# Patient Record
Sex: Female | Born: 2000 | Race: Black or African American | Hispanic: No | Marital: Single | Smoking: Never smoker
Health system: Southern US, Community
[De-identification: ages and names within clinical notes are randomized; demographics above are authoritative.]

## PROBLEM LIST (undated history)

## (undated) DIAGNOSIS — G43909 Migraine, unspecified, not intractable, without status migrainosus: Secondary | ICD-10-CM

## (undated) DIAGNOSIS — O009 Unspecified ectopic pregnancy without intrauterine pregnancy: Secondary | ICD-10-CM

## (undated) DIAGNOSIS — R011 Cardiac murmur, unspecified: Secondary | ICD-10-CM

## (undated) HISTORY — DX: Unspecified ectopic pregnancy without intrauterine pregnancy: O00.90

## (undated) HISTORY — PX: WISDOM TOOTH EXTRACTION: SHX21

---
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
## 2021-10-16 ENCOUNTER — Emergency Department (HOSPITAL_COMMUNITY): Payer: BC Managed Care – PPO

## 2021-10-16 ENCOUNTER — Encounter (HOSPITAL_COMMUNITY): Payer: Self-pay

## 2021-10-16 ENCOUNTER — Emergency Department (HOSPITAL_COMMUNITY)
Admission: EM | Admit: 2021-10-16 | Discharge: 2021-10-16 | Disposition: A | Discharge status: Home / Self Care | Payer: BC Managed Care – PPO | Attending: Emergency Medicine | Admitting: Emergency Medicine

## 2021-10-16 ENCOUNTER — Other Ambulatory Visit: Payer: Self-pay

## 2021-10-16 DIAGNOSIS — O209 Hemorrhage in early pregnancy, unspecified: Secondary | ICD-10-CM | POA: Diagnosis present

## 2021-10-16 DIAGNOSIS — O4690 Antepartum hemorrhage, unspecified, unspecified trimester: Secondary | ICD-10-CM

## 2021-10-16 DIAGNOSIS — N939 Abnormal uterine and vaginal bleeding, unspecified: Secondary | ICD-10-CM

## 2021-10-16 DIAGNOSIS — Z3A01 Less than 8 weeks gestation of pregnancy: Secondary | ICD-10-CM | POA: Diagnosis not present

## 2021-10-16 DIAGNOSIS — R102 Pelvic and perineal pain: Secondary | ICD-10-CM | POA: Insufficient documentation

## 2021-10-16 HISTORY — DX: Migraine, unspecified, not intractable, without status migrainosus: G43.909

## 2021-10-16 LAB — I-STAT BETA HCG BLOOD, ED (MC, WL, AP ONLY): I-stat hCG, quantitative: 73.3 m[IU]/mL — ABNORMAL HIGH (ref <5)

## 2021-10-16 LAB — CBC WITH DIFFERENTIAL/PLATELET
Abs Immature Granulocytes: 0.02 10*3/uL (ref 0.00–0.07)
Basophils Absolute: 0 10*3/uL (ref 0.0–0.1)
Basophils Relative: 0 %
Eosinophils Absolute: 0 10*3/uL (ref 0.0–0.5)
Eosinophils Relative: 1 %
HCT: 32.2 % — ABNORMAL LOW (ref 36.0–46.0)
Hemoglobin: 11.1 g/dL — ABNORMAL LOW (ref 12.0–15.0)
Immature Granulocytes: 0 %
Lymphocytes Relative: 17 %
Lymphs Abs: 0.8 10*3/uL (ref 0.7–4.0)
MCH: 26.4 pg (ref 26.0–34.0)
MCHC: 34.5 g/dL (ref 30.0–36.0)
MCV: 76.5 fL — ABNORMAL LOW (ref 80.0–100.0)
Monocytes Absolute: 0.3 10*3/uL (ref 0.1–1.0)
Monocytes Relative: 6 %
Neutro Abs: 3.5 10*3/uL (ref 1.7–7.7)
Neutrophils Relative %: 76 %
Platelets: 280 10*3/uL (ref 150–400)
RBC: 4.21 MIL/uL (ref 3.87–5.11)
RDW: 13.8 % (ref 11.5–15.5)
WBC: 4.7 10*3/uL (ref 4.0–10.5)
nRBC: 0 % (ref 0.0–0.2)

## 2021-10-16 LAB — ABO/RH: ABO/RH(D): A POS

## 2021-10-16 LAB — I-STAT CHEM 8, ED
BUN: 8 mg/dL (ref 6–20)
Calcium, Ion: 1.19 mmol/L (ref 1.15–1.40)
Chloride: 102 mmol/L (ref 98–111)
Creatinine, Ser: 0.7 mg/dL (ref 0.44–1.00)
Glucose, Bld: 152 mg/dL — ABNORMAL HIGH (ref 70–99)
HCT: 35 % — ABNORMAL LOW (ref 36.0–46.0)
Hemoglobin: 11.9 g/dL — ABNORMAL LOW (ref 12.0–15.0)
Potassium: 3.8 mmol/L (ref 3.5–5.1)
Sodium: 138 mmol/L (ref 135–145)
TCO2: 24 mmol/L (ref 22–32)

## 2021-10-16 LAB — HCG, QUANTITATIVE, PREGNANCY: hCG, Beta Chain, Quant, S: 90 m[IU]/mL — ABNORMAL HIGH (ref <5)

## 2021-10-16 MED ORDER — ACETAMINOPHEN 500 MG PO TABS
1000.0000 mg | ORAL_TABLET | Freq: Once | ORAL | Status: AC
  Administered 2021-10-16: 1000 mg via ORAL

## 2021-10-16 NOTE — ED Provider Notes (Signed)
Emergency Medicine Provider Triage Evaluation Note  Cassandra Beltran , a 20 y.o. female  was evaluated in triage.  Pt complains of vaginal bleeding.  Patient states she is approximately [redacted] weeks EGA.  She was seen at an urgent care on November 25 and had a positive pregnancy test she started having some bleeding last night as well as some cramping discomfort on the left lower abdomen..  Review of Systems  Positive: Vaginal bleeding, abdominal pain Negative: Fever  Physical Exam  BP 113/76 (BP Location: Right Arm)   Pulse 98   Temp 97.9 F (36.6 C) (Oral)   Resp 16   SpO2 100%  Gen:   Awake, no distress   Resp:  Normal effort  MSK:   Moves extremities without difficulty  Other:  Abdomen soft, no guarding, no peritoneal signs  Medical Decision Making  Medically screening exam initiated at 11:41 AM.  Appropriate orders placed.  Cassandra Beltran was informed that the remainder of the evaluation will be completed by another provider, this initial triage assessment does not replace that evaluation, and the importance of remaining in the ED until their evaluation is complete.  Will initiate work-up for possible ectopic versus threatened miscarriage.   Linwood Dibbles, MD 10/16/21 1143

## 2021-10-16 NOTE — Discharge Instructions (Addendum)
You have been seen and discharged from the emergency department.  Your pregnancy hormone is elevated at this visit.  You are approximately 4-[redacted] weeks pregnant based off of your last menstrual period.  Ultrasound does not identify an intrauterine gestation however this could be typical of early pregnancy.  It is also possible that you are having an early first trimester miscarriage.  It is extremely important that you follow-up within the next week with OB/GYN for repeat blood work and ultrasound for confirmation and further evaluation. Use pads for vaginal bleeding, avoid tampons. If you have any worsening symptoms, profuse vaginal bleeding, severe pelvic pain or further concerns for your health please return to an emergency department for further evaluation.

## 2021-10-16 NOTE — ED Provider Notes (Signed)
[facility] DEPT Provider Note   CSN: [id number] Arrival date & time: 10/16/21  1056     History Chief Complaint  Patient presents with   Vaginal Bleeding   Abdominal Pain    Cassandra Beltran is a 20 y.o. female.  HPI  20 year old female presents the emergency department with concern for vaginal bleeding in the setting of pregnancy.  Patient is G1, P0.  Last menstrual period.  She believes was 4 to 5 weeks ago.  Positive urine pregnancy test a couple days ago at urgent care.  She has not had any OB/GYN care yet.  Presents today with vaginal bleeding that was initially spotting overnight with bright red bleeding today.  Earlier today she passed a couple clots.  Feels as if the bleeding is resolving.  No significant lower pelvic pain.  Denies any fever or other abnormal vaginal discharge.  No dysuria.  Past Medical History:  Diagnosis Date   Heart murmur    Migraine     There are no problems to display for this patient.   History reviewed. No pertinent surgical history.   OB History   No obstetric history on file.     Family History  Problem Relation Age of Onset   Healthy Mother    Healthy Father     Social History   Tobacco Use   Smoking status: Never   Smokeless tobacco: Never  Vaping Use   Vaping Use: Never used  Substance Use Topics   Alcohol use: No   Drug use: No    Home Medications Prior to Admission medications   Not on File    Allergies    Patient has no known allergies.  Review of Systems   Review of Systems  Constitutional:  Positive for fatigue. Negative for chills and fever.  HENT:  Negative for congestion.   Eyes:  Negative for visual disturbance.  Respiratory:  Negative for shortness of breath.   Cardiovascular:  Negative for chest pain.  Gastrointestinal:  Negative for abdominal pain, diarrhea and vomiting.  Genitourinary:  Positive for vaginal bleeding. Negative for difficulty urinating, dysuria, flank  pain, pelvic pain, vaginal discharge and vaginal pain.  Skin:  Negative for rash.  Neurological:  Negative for headaches.   Physical Exam Updated Vital Signs BP 129/84   Pulse 78   Temp 97.9 F (36.6 C) (Oral)   Resp 18   Ht 5\' 3"  (1.6 m)   Wt 49 kg   LMP 09/08/2021   SpO2 100%   BMI 19.13 kg/m   Physical Exam Vitals and nursing note reviewed.  Constitutional:      General: She is not in acute distress.    Appearance: Normal appearance. She is not diaphoretic.  HENT:     Head: Normocephalic.     Mouth/Throat:     Mouth: Mucous membranes are moist.  Cardiovascular:     Rate and Rhythm: Normal rate.  Pulmonary:     Effort: Pulmonary effort is normal. No respiratory distress.  Abdominal:     Palpations: Abdomen is soft.     Tenderness: There is no abdominal tenderness. There is no guarding.  Genitourinary:    Comments: deferred Skin:    General: Skin is warm.  Neurological:     Mental Status: She is alert and oriented to person, place, and time. Mental status is at baseline.  Psychiatric:        Mood and Affect: Mood normal.    ED Results / Procedures /  Treatments   Labs (all labs ordered are listed, but only abnormal results are displayed) Labs Reviewed  CBC WITH DIFFERENTIAL/PLATELET - Abnormal; Notable for the following components:      Result Value   Hemoglobin 11.1 (*)    HCT 32.2 (*)    MCV 76.5 (*)    All other components within normal limits  I-STAT BETA HCG BLOOD, ED ([facility], [facility], [facility] ONLY) - Abnormal; Notable for the following components:   I-stat hCG, quantitative 73.3 (*)    All other components within normal limits  I-STAT CHEM 8, ED - Abnormal; Notable for the following components:   Glucose, Bld 152 (*)    Hemoglobin 11.9 (*)    HCT 35.0 (*)    All other components within normal limits  HCG, QUANTITATIVE, PREGNANCY  ABO/RH    EKG None  Radiology US OB LESS THAN 14 WEEKS WITH OB TRANSVAGINAL  Result Date: 10/16/2021 CLINICAL DATA:  Vaginal  bleeding and cramping. EXAM: OBSTETRIC <14 WK [place] AND TRANSVAGINAL OB US TECHNIQUE: Both transabdominal and transvaginal ultrasound examinations were performed for complete evaluation of the gestation as well as the maternal uterus, adnexal regions, and pelvic cul-de-sac. Transvaginal technique was performed to assess early pregnancy. COMPARISON:  None. FINDINGS: Intrauterine gestational sac: None Yolk sac:  Not Visualized. Embryo:  Not Visualized. Maternal uterus/adnexae: Subchorionic hemorrhage: Not applicable Right ovary: Appears normal. Left ovary: Normal containing corpus luteum. Other :None Free fluid:  Trace free fluid noted within the pelvis. IMPRESSION: 1. No intrauterine gestational sac, yolk sac, or fetal pole identified. Differential considerations include intrauterine pregnancy too early to be sonographically visualized, missed abortion, or ectopic pregnancy. Followup ultrasound is recommended in 10-14 days for further evaluation. 2. Left ovary corpus luteum. Electronically Signed   By: Kerby Moors M.D.   On: 10/16/2021 16:41    Procedures Procedures   Medications Ordered in ED Medications - No data to display  ED Course  I have reviewed the triage vital signs and the nursing notes.  Pertinent labs & imaging results that were available during my care of the patient were reviewed by me and considered in my medical decision making (see chart for details).    MDM Rules/Calculators/A&P                           20 year old female presents emergency department with vaginal bleeding in the setting of early pregnancy.  Patient is approximately 4 to [redacted] weeks pregnant based off of last menstrual period.  She denies any vaginal/pelvic pain besides mild cramping.  Patient had bright red bleeding with clots earlier, seems to be slowing down currently.  I-STAT hCG is elevated at 73, blood work is otherwise reassuring.  She is type a positive.  Pelvic ultrasound identifies no intrauterine  gestation, most likely compatible with early pregnancy or miscarriage.  Low suspicion for ectopic given the absence of pain at this time.  Patient is in the process of establishing OB/GYN care, she has an office that she is calling tomorrow.  I will also supply her with other clinic information.  She understands the importance of having repeat blood work hCG quant and repeat ultrasound for further evaluation.  Discussed strict return to ED precautions in regards to worsening bleeding, worsening pelvic pain, fever.  Patient at this time appears well, vitals are stable, abdomen is benign.  Patient at this time appears safe and stable for discharge and will be treated as an outpatient.  Discharge plan and strict return to ED precautions discussed, patient verbalizes understanding and agreement.  Final Clinical Impression(s) / ED Diagnoses Final diagnoses:  Vaginal bleeding in pregnancy    Rx / DC Orders ED Discharge Orders     None        Rozelle Logan, DO 10/16/21 2109

## 2021-10-16 NOTE — ED Triage Notes (Signed)
Patient reports that she is [redacted] weeks pregnant and had a slight amount of vaginal bleeding last night. Patient states when she woke this AM she had a moderate amount of vaginal bleeding and clots present. Patent c/o intermittent sharp LLQ pain that started last night.

## 2021-10-18 ENCOUNTER — Other Ambulatory Visit: Payer: Self-pay | Admitting: *Deleted

## 2021-10-18 DIAGNOSIS — O3680X0 Pregnancy with inconclusive fetal viability, not applicable or unspecified: Secondary | ICD-10-CM

## 2021-10-18 NOTE — Progress Notes (Signed)
Patient scheduled for stat beta hcg quant. Patient seen at [facility] ED on 10/16/2021 for vaginal bleeding and abdominal pain. Positive UPT at urgent.  Clovis Pu, RN

## 2021-10-19 ENCOUNTER — Other Ambulatory Visit: Payer: Self-pay

## 2021-10-19 ENCOUNTER — Other Ambulatory Visit (INDEPENDENT_AMBULATORY_CARE_PROVIDER_SITE_OTHER): Payer: Medicaid Other | Admitting: *Deleted

## 2021-10-19 ENCOUNTER — Telehealth: Payer: Self-pay

## 2021-10-19 DIAGNOSIS — O3680X0 Pregnancy with inconclusive fetal viability, not applicable or unspecified: Secondary | ICD-10-CM

## 2021-10-19 LAB — BETA HCG QUANT (REF LAB): hCG Quant: 92 m[IU]/mL

## 2021-10-19 NOTE — Progress Notes (Signed)
   Ms. Cassandra Beltran presents to [facility] for follow-up quant hCG blood draw today. She was seen in [facility] ED for  vaginal bleeding and abdominal pain  on 10/16/2021. Patient reporting bleeding today. Discussed with patient, we are following hCG levels today.  Valid contact number for patient confirmed. Patient will get a phone call or Mychart message regarding lab results.    Clovis Pu 10/19/2021 8:06 AM

## 2021-10-19 NOTE — Telephone Encounter (Signed)
Cassandra Beltran 2001/08/19 [id number]  Patient called and verified her identity via birth date and last 4 of her SSN.  Patient agreeable to results via phone and was informed of hCG results with minimal increase from 90 to 92.  Patient reports she continues to have bleeding that is "consistent."  She was informed of POC, as established by MD, to return to MAU on Sunday at 0800 for repeat hCG.  Informed that expectation would be to remain in MAU or be willing to return for further treatment if necessary.  Patient verbalizes understanding. Precautions given.  Patient without questions. Encouraged to call or send mychart message if any concerns should arise.  Cherre Robins MSN, CNM Advanced Practice Provider, [facility]  **This visit was completed, in its entirety, via telehealth communications.  I personally spent >/=2 minutes on the phone providing recommendations, education, and guidance.**

## 2021-10-21 ENCOUNTER — Inpatient Hospital Stay (HOSPITAL_COMMUNITY): Payer: Medicaid Other | Attending: Obstetrics and Gynecology

## 2021-10-21 ENCOUNTER — Encounter (HOSPITAL_COMMUNITY): Payer: Self-pay | Admitting: Obstetrics and Gynecology

## 2021-10-21 ENCOUNTER — Inpatient Hospital Stay (HOSPITAL_COMMUNITY)
Admission: AD | Admit: 2021-10-21 | Discharge: 2021-10-21 | Disposition: A | Discharge status: Home / Self Care | Payer: BC Managed Care – PPO | Attending: Obstetrics and Gynecology | Admitting: Obstetrics and Gynecology

## 2021-10-21 ENCOUNTER — Other Ambulatory Visit: Payer: Self-pay

## 2021-10-21 DIAGNOSIS — Z3A01 Less than 8 weeks gestation of pregnancy: Secondary | ICD-10-CM | POA: Diagnosis not present

## 2021-10-21 DIAGNOSIS — Z6790 Unspecified blood type, Rh positive: Secondary | ICD-10-CM | POA: Insufficient documentation

## 2021-10-21 DIAGNOSIS — O0281 Inappropriate change in quantitative human chorionic gonadotropin (hCG) in early pregnancy: Secondary | ICD-10-CM | POA: Insufficient documentation

## 2021-10-21 DIAGNOSIS — O009 Unspecified ectopic pregnancy without intrauterine pregnancy: Secondary | ICD-10-CM | POA: Diagnosis not present

## 2021-10-21 LAB — COMPREHENSIVE METABOLIC PANEL
ALT: 11 U/L (ref 0–44)
AST: 13 U/L — ABNORMAL LOW (ref 15–41)
Albumin: 3.8 g/dL (ref 3.5–5.0)
Alkaline Phosphatase: 58 U/L (ref 38–126)
Anion gap: 7 (ref 5–15)
BUN: 7 mg/dL (ref 6–20)
CO2: 25 mmol/L (ref 22–32)
Calcium: 9 mg/dL (ref 8.9–10.3)
Chloride: 105 mmol/L (ref 98–111)
Creatinine, Ser: 0.81 mg/dL (ref 0.44–1.00)
GFR, Estimated: >60 mL/min (ref >60)
Glucose, Bld: 92 mg/dL (ref 70–99)
Potassium: 4 mmol/L (ref 3.5–5.1)
Sodium: 137 mmol/L (ref 135–145)
Total Bilirubin: 0.6 mg/dL (ref 0.3–1.2)
Total Protein: 7 g/dL (ref 6.5–8.1)

## 2021-10-21 LAB — HCG, QUANTITATIVE, PREGNANCY: hCG, Beta Chain, Quant, S: 117 m[IU]/mL — ABNORMAL HIGH (ref <5)

## 2021-10-21 MED ORDER — METHOTREXATE FOR ECTOPIC PREGNANCY
50.0000 mg/m2 | Freq: Once | INTRAMUSCULAR | Status: AC
  Administered 2021-10-21: 12:00:00 72.5 mg via INTRAMUSCULAR
  Filled 2021-10-21: qty 10

## 2021-10-21 NOTE — MAU Note (Signed)
Presents for HCG level.  Denies VB or pain.

## 2021-10-21 NOTE — MAU Provider Note (Signed)
History   Chief Complaint:  HCG Level   Cassandra Beltran is  20 y.o. G1P0 Patient's last menstrual period was 09/08/2021.[name] Patient is here for follow up of quantitative HCG and ongoing surveillance of pregnancy status. She is [redacted]w[redacted]d weeks gestation  by LMP.    Since her last visit, the patient is without new complaint. The patient reports bleeding as  none now.  She denies any pain.  General ROS:  negative  Her previous Quantitative HCG values are:  11/29 = 90 12/2 = 92   Physical Exam   Blood pressure 112/68, pulse 79, temperature 98.3 F (36.8 C), temperature source Oral, resp. rate 19, height 5\' 3"  (1.6 m), weight 48 kg, last menstrual period 09/08/2021, SpO2 98 %.  Physical Examination: General appearance - alert, well appearing, and in no distress Mental status - normal mood, behavior, speech, dress, motor activity, and thought processes Eyes - sclera anicteric Chest - normal respiratory effort Abdomen - soft, nontender, nondistended, no masses or organomegaly Skin - warm & dry  Labs: Results for orders placed or performed during the hospital encounter of 10/21/21 (from the past 24 hour(s))  hCG, quantitative, pregnancy   Collection Time: 10/21/21  9:11 AM  Result Value Ref Range   hCG, Beta Chain, Quant, S 117 (H) <5 mIU/mL  Comprehensive metabolic panel   Collection Time: 10/21/21  9:11 AM  Result Value Ref Range   Sodium 137 135 - 145 mmol/L   Potassium 4.0 3.5 - 5.1 mmol/L   Chloride 105 98 - 111 mmol/L   CO2 25 22 - 32 mmol/L   Glucose, Bld 92 70 - 99 mg/dL   BUN 7 6 - 20 mg/dL   Creatinine, Ser 14/04/22 0.44 - 1.00 mg/dL   Calcium 9.0 8.9 - 2.97 mg/dL   Total Protein 7.0 6.5 - 8.1 g/dL   Albumin 3.8 3.5 - 5.0 g/dL   AST 13 (L) 15 - 41 U/L   ALT 11 0 - 44 U/L   Alkaline Phosphatase 58 38 - 126 U/L   Total Bilirubin 0.6 0.3 - 1.2 mg/dL   GFR, Estimated 98.9 >21 mL/min   Anion gap 7 5 - 15    Ultrasound Studies:   No results found.   MDM: Continues  inappropriate rise in HCG. Patient is stable. RH positive.  Reviewed with Dr. >19 who recommends methotrexate.   The risks of methotrexate were reviewed including failure requiring repeat dosing or eventual surgery. She understands that methotrexate involves frequent return visits to monitor lab values and that she remains at risk of ectopic rupture until her beta is less than assay. ?The patient opts to proceed with methotrexate.  She has no history of hepatic or renal dysfunction, has normal BUN/Cr/LFT's/platelets.  She is felt to be reliable for follow-up. Side effects of photosensitivity & GI upset were discussed.  She knows to avoid direct sunlight and abstain from alcohol, aspirin and aspirin-like products for two weeks. She was counseled to discontinue any MVI with folic acid. ?She understands to follow up on D4 (Wednesday) and D7 (Saturday) for repeat BHCG and was given the instruction sheet. ?Strict ectopic precautions were reviewed, the patient knows to call with any abdominal pain, vomiting, fainting, or any concerns with her health.  Rh+, no Rhogam necessary   Assessment:   1. Ectopic pregnancy without intrauterine pregnancy, unspecified location   2. Inappropriate change in quantitative hCG in early pregnancy   3. [redacted] weeks gestation of pregnancy  Plan: -Discharge home in stable condition -ectopic precautions discussed -Patient advised to follow-up - scheduled for HCG in the office on Wednesday morning -Patient may return to MAU as needed or if her condition were to change or worsen  Judeth Horn, NP 10/21/2021, 6:13 PM

## 2021-10-24 ENCOUNTER — Ambulatory Visit (INDEPENDENT_AMBULATORY_CARE_PROVIDER_SITE_OTHER): Payer: Medicaid Other

## 2021-10-24 ENCOUNTER — Other Ambulatory Visit: Payer: Self-pay

## 2021-10-24 VITALS — BP 113/71 | HR 85 | Ht 63.0 in | Wt 105.5 lb

## 2021-10-24 DIAGNOSIS — O009 Unspecified ectopic pregnancy without intrauterine pregnancy: Secondary | ICD-10-CM

## 2021-10-24 DIAGNOSIS — O0281 Inappropriate change in quantitative human chorionic gonadotropin (hCG) in early pregnancy: Secondary | ICD-10-CM

## 2021-10-24 LAB — BETA HCG QUANT (REF LAB): hCG Quant: 139 m[IU]/mL

## 2021-10-24 NOTE — Progress Notes (Signed)
Pt here today for STAT Beta from MAU follow up on 10/21/21. Pt had MTX 4 days ago. Pt had last bleed for 5 days before going to the MAU.   Pt denies any vaginal bleeding, pain or cramps. Pt advised after results come back from Beta, will be contacted via phone with plan of care per Dr Donavan Foil. Pt verbalized understanding.    Judeth Cornfield, RN

## 2021-10-24 NOTE — Progress Notes (Signed)
Results reviewed by Dr Donavan Foil. Pt advised normal for MTX. Pt advised to come back for day 7 draw at MAU. Pt called and given results and recommendations. Pt verbalized understanding.  Judeth Cornfield, RN

## 2021-10-25 NOTE — Progress Notes (Signed)
Patient was assessed and managed by nursing staff during this encounter. I have reviewed the chart and agree with the documentation and plan. I have also made any necessary editorial changes.  Amauri Medellin A Aliana Kreischer, MD 10/25/2021 8:50 AM   

## 2021-10-27 ENCOUNTER — Other Ambulatory Visit: Payer: Self-pay

## 2021-10-27 ENCOUNTER — Inpatient Hospital Stay (HOSPITAL_COMMUNITY)
Admission: AD | Admit: 2021-10-27 | Discharge: 2021-10-27 | Disposition: A | Discharge status: Home / Self Care | Payer: BC Managed Care – PPO | Attending: Obstetrics and Gynecology | Admitting: Obstetrics and Gynecology

## 2021-10-27 DIAGNOSIS — O009 Unspecified ectopic pregnancy without intrauterine pregnancy: Secondary | ICD-10-CM | POA: Insufficient documentation

## 2021-10-27 DIAGNOSIS — Z3A01 Less than 8 weeks gestation of pregnancy: Secondary | ICD-10-CM | POA: Diagnosis not present

## 2021-10-27 LAB — COMPREHENSIVE METABOLIC PANEL
ALT: 14 U/L (ref 0–44)
AST: 16 U/L (ref 15–41)
Albumin: 3.9 g/dL (ref 3.5–5.0)
Alkaline Phosphatase: 57 U/L (ref 38–126)
Anion gap: 7 (ref 5–15)
BUN: 7 mg/dL (ref 6–20)
CO2: 24 mmol/L (ref 22–32)
Calcium: 9 mg/dL (ref 8.9–10.3)
Chloride: 105 mmol/L (ref 98–111)
Creatinine, Ser: 0.76 mg/dL (ref 0.44–1.00)
GFR, Estimated: >60 mL/min (ref >60)
Glucose, Bld: 79 mg/dL (ref 70–99)
Potassium: 3.6 mmol/L (ref 3.5–5.1)
Sodium: 136 mmol/L (ref 135–145)
Total Bilirubin: 0.7 mg/dL (ref 0.3–1.2)
Total Protein: 6.8 g/dL (ref 6.5–8.1)

## 2021-10-27 LAB — HCG, QUANTITATIVE, PREGNANCY: hCG, Beta Chain, Quant, S: 174 m[IU]/mL — ABNORMAL HIGH (ref <5)

## 2021-10-27 MED ORDER — METHOTREXATE FOR ECTOPIC PREGNANCY
50.0000 mg/m2 | Freq: Once | INTRAMUSCULAR | Status: AC
  Administered 2021-10-27: 72.5 mg via INTRAMUSCULAR
  Filled 2021-10-27: qty 10

## 2021-10-27 NOTE — MAU Note (Signed)
Pt is 7 days post MTX, here for blood work.  No complaints, feeling better, no real pain. Stopped bleeding a few days ago.

## 2021-10-27 NOTE — MAU Provider Note (Signed)
History   Chief Complaint:  Follow-up   Cassandra Beltran is  20 y.o. G1P0 Patient's last menstrual period was 09/08/2021.[name] Patient is here for follow up of quantitative HCG, she is day 7 s/p methotrexate for ectopic pregnancy. She is [redacted]w[redacted]d weeks gestation  by LMP.    Since her last visit, the patient is without new complaint. The patient reports bleeding as  none now.  She denies any pain.  General ROS:  negative for abdominal pain  Her previous Quantitative HCG values are:  Day 0/1 (12/4): 117 Day 4 (12/7): 139   Physical Exam   Blood pressure 116/63, pulse 73, temperature 98.2 F (36.8 C), temperature source Oral, resp. rate 16, height 5\' 3"  (1.6 m), weight 49.4 kg, last menstrual period 09/08/2021, SpO2 100 %.  Physical Examination: General appearance - alert, well appearing, and in no distress Mental status - normal mood, behavior, speech, dress, motor activity, and thought processes Eyes - sclera anicteric Chest - normal respiratory effort  Labs: Results for orders placed or performed during the hospital encounter of 10/27/21 (from the past 24 hour(s))  hCG, quantitative, pregnancy   Collection Time: 10/27/21  9:47 AM  Result Value Ref Range   hCG, Beta Chain, Quant, S 174 (H) <5 mIU/mL  Comprehensive metabolic panel   Collection Time: 10/27/21  9:47 AM  Result Value Ref Range   Sodium 136 135 - 145 mmol/L   Potassium 3.6 3.5 - 5.1 mmol/L   Chloride 105 98 - 111 mmol/L   CO2 24 22 - 32 mmol/L   Glucose, Bld 79 70 - 99 mg/dL   BUN 7 6 - 20 mg/dL   Creatinine, Ser 14/10/22 0.44 - 1.00 mg/dL   Calcium 9.0 8.9 - 8.46 mg/dL   Total Protein 6.8 6.5 - 8.1 g/dL   Albumin 3.9 3.5 - 5.0 g/dL   AST 16 15 - 41 U/L   ALT 14 0 - 44 U/L   Alkaline Phosphatase 57 38 - 126 U/L   Total Bilirubin 0.7 0.3 - 1.2 mg/dL   GFR, Estimated 65.9 >93 mL/min   Anion gap 7 5 - 15    Ultrasound Studies:   No results found.  Assessment:   1. Ectopic pregnancy without intrauterine pregnancy,  unspecified location    -HCG continues to rise. Benign abdominal exam. Reviewed with Dr. >57 - recommends second dose of methotrexate.   The risks of methotrexate were reviewed including failure requiring repeat dosing or eventual surgery. She understands that methotrexate involves frequent return visits to monitor lab values and that she remains at risk of ectopic rupture until her beta is less than assay. ?The patient opts to proceed with methotrexate.  She has no history of hepatic or renal dysfunction, has normal BUN/Cr/LFT's/platelets.  She is felt to be reliable for follow-up. Side effects of photosensitivity & GI upset were discussed.  She knows to avoid direct sunlight and abstain from alcohol, aspirin and aspirin-like products for two weeks. She was counseled to discontinue any MVI with folic acid. ?She understands to follow up on D4 (Tuesday) and D7 (Friday) for repeat BHCG and was given the instruction sheet. ?Strict ectopic precautions were reviewed, the patient knows to call with any abdominal pain, vomiting, fainting, or any concerns with her health.  Rh+, no Rhogam necessary    Plan: -Discharge home in stable condition -Ectopic precautions discussed -Patient advised to follow-up with [facility] on Tuesday morning for day 4 labs -Patient may return to MAU as needed or if  her condition were to change or worsen  Judeth Horn, NP 10/27/2021, 1:04 PM

## 2021-10-28 ENCOUNTER — Encounter (HOSPITAL_COMMUNITY): Payer: Self-pay | Admitting: Obstetrics and Gynecology

## 2021-10-28 ENCOUNTER — Other Ambulatory Visit: Payer: Self-pay

## 2021-10-28 ENCOUNTER — Inpatient Hospital Stay (HOSPITAL_COMMUNITY)
Admission: AD | Admit: 2021-10-28 | Discharge: 2021-10-28 | Disposition: A | Discharge status: Home / Self Care | Payer: BC Managed Care – PPO | Attending: Obstetrics and Gynecology | Admitting: Obstetrics and Gynecology

## 2021-10-28 DIAGNOSIS — Z049 Encounter for examination and observation for unspecified reason: Secondary | ICD-10-CM | POA: Diagnosis not present

## 2021-10-28 DIAGNOSIS — Z8759 Personal history of other complications of pregnancy, childbirth and the puerperium: Secondary | ICD-10-CM | POA: Diagnosis not present

## 2021-10-28 DIAGNOSIS — O009 Unspecified ectopic pregnancy without intrauterine pregnancy: Secondary | ICD-10-CM | POA: Diagnosis not present

## 2021-10-28 LAB — CBC
HCT: 31.5 % — ABNORMAL LOW (ref 36.0–46.0)
Hemoglobin: 11 g/dL — ABNORMAL LOW (ref 12.0–15.0)
MCH: 26.3 pg (ref 26.0–34.0)
MCHC: 34.9 g/dL (ref 30.0–36.0)
MCV: 75.2 fL — ABNORMAL LOW (ref 80.0–100.0)
Platelets: 287 10*3/uL (ref 150–400)
RBC: 4.19 MIL/uL (ref 3.87–5.11)
RDW: 13.6 % (ref 11.5–15.5)
WBC: 3 10*3/uL — ABNORMAL LOW (ref 4.0–10.5)
nRBC: 0 % (ref 0.0–0.2)

## 2021-10-28 NOTE — MAU Provider Note (Signed)
Event Date/Time  First Provider Initiated Contact with Patient 10/28/21 1229     S Ms. Cassandra Beltran is a 20 y.o. G1P0 patient who presents to MAU today with complaint of heavy bleeding. She is s/p second Methotrexate yesterday 10/27/21 for inappropriate rise in quant and concern for ectopic pregnancy. Patient endorses bleeding through her pants at 0930 this morning while at work. She has changed her pad twice since then but has not saturated them. She denies weakness, dizziness, syncope.  O BP 111/61 (BP Location: Right Arm)   Pulse 80   Temp 98.6 F (37 C) (Oral)   Resp 16   LMP 09/08/2021   SpO2 100%    Physical Exam Vitals and nursing note reviewed. Exam conducted with a chaperone present.  Constitutional:      Appearance: Normal appearance.  Cardiovascular:     Rate and Rhythm: Normal rate.     Pulses: Normal pulses.  Pulmonary:     Effort: Pulmonary effort is normal.  Genitourinary:    Comments: Pad donned one hour ago has less than half full Skin:    Capillary Refill: Capillary refill takes less than 2 seconds.  Neurological:     Mental Status: She is alert and oriented to person, place, and time.  Psychiatric:        Mood and Affect: Mood normal.        Behavior: Behavior normal.        Thought Content: Thought content normal.        Judgment: Judgment normal.    A Medical screening exam complete Bleeding now scant to small, WNL s/p MTX Hgb 11.0 Quant hCG not collected   P Discharge from MAU in stable condition with ectopic precautions  F/U Patient aware she needs to schedule Day 4 (Tuesday) Quant hCG   Clayton Bibles, MSA, MSN, CNM 10/28/2021 3:54 PM

## 2021-10-28 NOTE — MAU Note (Signed)
Received methotrexate yesterday.  Was having brownish d/c then.  Woke up this morning with bleeding, kind of like at period. Went on to work.  At 0930, bleeding became heavy, soaked pad and bled through clothes. Is on 3rd pad. Some discomfort in lower abd.

## 2021-10-30 ENCOUNTER — Other Ambulatory Visit (INDEPENDENT_AMBULATORY_CARE_PROVIDER_SITE_OTHER): Payer: Medicaid Other

## 2021-10-30 ENCOUNTER — Other Ambulatory Visit: Payer: Self-pay

## 2021-10-30 VITALS — BP 128/71 | HR 68 | Wt 108.9 lb

## 2021-10-30 DIAGNOSIS — O009 Unspecified ectopic pregnancy without intrauterine pregnancy: Secondary | ICD-10-CM

## 2021-10-30 LAB — BETA HCG QUANT (REF LAB): hCG Quant: 108 m[IU]/mL

## 2021-10-30 NOTE — Progress Notes (Signed)
Beta HCG Follow-up Visit  Cassandra Beltran presents to [facility] for follow-up beta HCG lab. She was seen in MAU for second dose of MTX to treat ectopic pregnancy on 10/27/21. Patient denies  pain or bleeding  today. Discussed with patient that we are following beta HCG levels today. Results will be back in approximately 2 hours. Valid contact number for patient confirmed. I will call the patient with results.   Beta HCG results: 10/27/21 0947 DAY 1 174  10/30/21 0854 DAY 4 108   Results and patient history reviewed with Crissie Reese, MD, who states patient should repeat beta HCG on DAY 7 which will be Friday, 11/02/21. Patient called and informed of plan for follow-up.  [name] 10/30/2021 8:51 AM

## 2021-10-30 NOTE — Progress Notes (Signed)
Chart reviewed for nurse visit. Agree with plan of care.   Venora Maples, MD 10/30/21 1:26 PM

## 2021-11-02 ENCOUNTER — Ambulatory Visit (INDEPENDENT_AMBULATORY_CARE_PROVIDER_SITE_OTHER): Payer: Medicaid Other

## 2021-11-02 ENCOUNTER — Other Ambulatory Visit: Payer: Self-pay

## 2021-11-02 VITALS — BP 126/76 | HR 69

## 2021-11-02 DIAGNOSIS — O009 Unspecified ectopic pregnancy without intrauterine pregnancy: Secondary | ICD-10-CM

## 2021-11-02 LAB — BETA HCG QUANT (REF LAB): hCG Quant: 68 m[IU]/mL

## 2021-11-02 NOTE — Progress Notes (Signed)
Pt here today for STAT beta s/p day 7 second dose MTX.  Pt denies bleeding or pain.  Pt advised that we will call her with results and f/u.   Pt verbalized understanding.  Notified Dr. Ephriam Jenkins that stat results are still pending.  Ephriam Jenkins informed me that she will let the on call attending know.    Sarenity Ramaker,RN  11/02/21

## 2021-11-06 ENCOUNTER — Ambulatory Visit: Payer: Medicaid Other

## 2021-11-06 ENCOUNTER — Telehealth: Payer: Self-pay

## 2021-11-06 NOTE — Telephone Encounter (Addendum)
-----   Message from Catalina Antigua, MD sent at 11/05/2021  8:17 AM EST ----- Please inform patient of decreasing quant HCG. She needs to return weekly until it is negative   Called pt; results and provider recommendation given. Pt not available on 11/09/21 (one week from prior draw). Scheduled for 11/13/21 at 0930.

## 2021-11-13 ENCOUNTER — Other Ambulatory Visit: Payer: Self-pay

## 2021-11-13 ENCOUNTER — Other Ambulatory Visit: Payer: Medicaid Other

## 2021-11-13 DIAGNOSIS — O009 Unspecified ectopic pregnancy without intrauterine pregnancy: Secondary | ICD-10-CM

## 2021-11-13 LAB — BETA HCG QUANT (REF LAB): hCG Quant: 20 m[IU]/mL

## 2021-11-20 ENCOUNTER — Telehealth: Payer: Self-pay | Admitting: Family Medicine

## 2021-11-20 ENCOUNTER — Other Ambulatory Visit: Payer: Self-pay

## 2021-11-20 DIAGNOSIS — Z8759 Personal history of other complications of pregnancy, childbirth and the puerperium: Secondary | ICD-10-CM

## 2021-11-20 NOTE — Telephone Encounter (Addendum)
Returned patient's phone call from front office message. Pt reports bleeding has decreased now. Reviewed normal bleeding following ectopic pregnancy. Return precautions reviewed. Pt also to be given following results and provider recommendation:  ---------------------------------------------------------------- Cassandra Mccreedy, MD  P [facility]  This patient's HCG level is continuing to downtrend however still is 20. Could you have her come back in sometime this week for a non-STAT hcg?   Thanks  Dr.Das  ---------------------------------------------------------------- Patient scheduled for tomorrow at 10:30 for lab appt.

## 2021-11-20 NOTE — Telephone Encounter (Signed)
Patient called in and said she is bleeding as if it is her menstrual, she just had an ectopic pregnancy and received an MTX injection but she wants to know if it is too soon to be bleeding like this again

## 2021-11-21 ENCOUNTER — Other Ambulatory Visit: Payer: Self-pay

## 2021-11-21 ENCOUNTER — Other Ambulatory Visit: Payer: BC Managed Care – PPO

## 2021-11-21 DIAGNOSIS — Z8759 Personal history of other complications of pregnancy, childbirth and the puerperium: Secondary | ICD-10-CM

## 2021-11-22 LAB — BETA HCG QUANT (REF LAB): hCG Quant: <1 m[IU]/mL

## 2022-01-04 ENCOUNTER — Other Ambulatory Visit: Payer: Self-pay

## 2022-01-04 ENCOUNTER — Encounter: Payer: Self-pay | Admitting: Psychiatry

## 2022-01-04 ENCOUNTER — Ambulatory Visit: Payer: BC Managed Care – PPO | Admitting: Psychiatry

## 2022-01-04 VITALS — BP 116/73 | HR 73 | Ht 63.0 in | Wt 111.0 lb

## 2022-01-04 DIAGNOSIS — M542 Cervicalgia: Secondary | ICD-10-CM

## 2022-01-04 DIAGNOSIS — G43109 Migraine with aura, not intractable, without status migrainosus: Secondary | ICD-10-CM | POA: Diagnosis not present

## 2022-01-04 MED ORDER — AMITRIPTYLINE HCL 25 MG PO TABS: ORAL_TABLET | ORAL | 3 refills | Status: DC

## 2022-01-04 NOTE — Patient Instructions (Addendum)
Start amitriptyline for headache prevention. Take 1/2 tablet at bedtime for a week. If no side effects can increase to one tablet at bedtime Start physical therapy for the neck Take rizatriptan the onset of migraine. If headache recurs or does not fully resolve, you may take a second dose after 2 hours. Please avoid taking more than 2 days per week

## 2022-01-04 NOTE — Progress Notes (Signed)
Referring:  Phill Mutter, NP [place],  [place] [id number]  PCP: Phill Mutter, NP  Neurology was asked to evaluate Richelle Ito, a 21 year old female for a chief complaint of headaches.  Our recommendations of care will be communicated by shared medical record.    CC:  headaches  HPI:  Medical co-morbidities: none  The patient presents for evaluation of migraines which began in 7th grade. She currently gets migraines 1-2 times per week. They are described as right frontal throbbing with associated photophobia and phonophobia. She does occasionally get a visual aura of flashing lights with her migraines. Headaches last 1-2 days at a time.  She has tried [employer] and Tylenol which are generally not effective. She was prescribed Maxalt 10 mg as needed recently, but has not tried it yet because she has not had a migraine since picking it up.  Headache History: Onset: 7th grade Triggers: loud noises, bright lights Aura: flashing lights Location: right face Quality/Description: throbbing Associated Symptoms:  Photophobia: yes  Phonophobia: yes  Nausea: rarely Worse with activity?: yes Duration of headaches: 1-2 days  Headache days per month: 8 Headache free days per month: 22  Current Treatment: Abortive Maxalt 10 mg PRN  Preventative none  Prior Therapies                                 Tylenol Goody powder   LABS: CBC    Component Value Date/Time   WBC 3.0 (L) 10/28/2021 1205   RBC 4.19 10/28/2021 1205   HGB 11.0 (L) 10/28/2021 1205   HCT 31.5 (L) 10/28/2021 1205   PLT 287 10/28/2021 1205   MCV 75.2 (L) 10/28/2021 1205   MCH 26.3 10/28/2021 1205   MCHC 34.9 10/28/2021 1205   RDW 13.6 10/28/2021 1205   LYMPHSABS 0.8 10/16/2021 1244   MONOABS 0.3 10/16/2021 1244   EOSABS 0.0 10/16/2021 1244   BASOSABS 0.0 10/16/2021 1244   CMP Latest Ref Rng & Units 10/27/2021 10/21/2021 10/16/2021  Glucose 70 - 99 mg/dL 79 92 152(H)  BUN 6  - 20 mg/dL 7 7 8   Creatinine 0.44 - 1.00 mg/dL 0.76 0.81 0.70  Sodium 135 - 145 mmol/L 136 137 138  Potassium 3.5 - 5.1 mmol/L 3.6 4.0 3.8  Chloride 98 - 111 mmol/L 105 105 102  CO2 22 - 32 mmol/L 24 25 -  Calcium 8.9 - 10.3 mg/dL 9.0 9.0 -  Total Protein 6.5 - 8.1 g/dL 6.8 7.0 -  Total Bilirubin 0.3 - 1.2 mg/dL 0.7 0.6 -  Alkaline Phos 38 - 126 U/L 57 58 -  AST 15 - 41 U/L 16 13(L) -  ALT 0 - 44 U/L 14 11 -     IMAGING:  CTH 2004 was normal   Current Outpatient Medications on File Prior to Visit  Medication Sig Dispense Refill   rizatriptan (MAXALT-MLT) 10 MG disintegrating tablet Take 1 tablet by mouth as directed.     No current facility-administered medications on file prior to visit.     Allergies: No Known Allergies  Family History: Migraine or other headaches in the family:  no Aneurysms in a first degree relative:  no Brain tumors in the family:  no Other neurological illness in the family:   no  Past Medical History: Past Medical History:  Diagnosis Date   Ectopic pregnancy    09/2021   Heart murmur  Migraine     Past Surgical History History reviewed. No pertinent surgical history.  Social History: Social History   Tobacco Use   Smoking status: Never   Smokeless tobacco: Never  Vaping Use   Vaping Use: Never used  Substance Use Topics   Alcohol use: No   Drug use: No     ROS: Negative for fevers, chills. Positive for headaches. All other systems reviewed and negative unless stated otherwise in HPI.   Physical Exam:   Vital Signs: BP 116/73    Pulse 73    Ht 5\' 3"  (1.6 m)    Wt 111 lb (50.3 kg)    LMP 09/08/2021    Breastfeeding Unknown    BMI 19.66 kg/m  GENERAL: well appearing,in no acute distress,alert SKIN:  Color, texture, turgor normal. No rashes or lesions HEAD:  Normocephalic/atraumatic. CV:  RRR RESP: Normal respiratory effort MSK: +tenderness to palpation over bilateral temples, right occiput, neck, and  shoulders  NEUROLOGICAL: Mental Status: Alert, oriented to person, place and time,Follows commands Cranial Nerves: PERRL,visual fields intact to confrontation,extraocular movements intact,facial sensation intact,no facial droop or ptosis,hearing grossly intact,no dysarthria Motor: muscle strength 5/5 both upper and lower extremities,no drift, normal tone Reflexes: 2+ throughout Sensation: intact to light touch all 4 extremities Coordination: Finger-to- nose-finger intact bilaterally Gait: normal-based   IMPRESSION: 21 year old female without significant past medical history who presents for evaluation of migraines with aura. She is currently having up to 8 migraines per month. Discussed treatment options, and she would like to try a daily preventive medication. Will start amitriptyline for headache prevention. Referral for neck PT placed to help with her cervicalgia. Encouraged her to try rizatriptan during her next migraine.  PLAN: -Prevention: Start amitriptyline 12.5 mg QHS x1 week, then increase to 25 mg QHS -Rescue: Maxalt 10 mg PRN -Referral to neck PT   I spent a total of 19 minutes chart reviewing and counseling the patient. Headache education was done. Discussed treatment options including preventive and acute medications, and physical therapy. Discussed medication side effects, adverse reactions and drug interactions. Written educational materials and patient instructions outlining all of the above were given.  Follow-up: 3 months   Genia Harold, MD 01/04/2022   10:05 AM

## 2022-01-17 ENCOUNTER — Ambulatory Visit: Payer: BC Managed Care – PPO | Attending: Psychiatry | Admitting: Physical Therapy

## 2022-01-17 ENCOUNTER — Other Ambulatory Visit: Payer: Self-pay

## 2022-01-17 ENCOUNTER — Encounter: Payer: Self-pay | Admitting: Physical Therapy

## 2022-01-17 DIAGNOSIS — M542 Cervicalgia: Secondary | ICD-10-CM | POA: Diagnosis present

## 2022-01-17 DIAGNOSIS — G4486 Cervicogenic headache: Secondary | ICD-10-CM | POA: Insufficient documentation

## 2022-01-17 DIAGNOSIS — R29898 Other symptoms and signs involving the musculoskeletal system: Secondary | ICD-10-CM | POA: Diagnosis present

## 2022-01-17 NOTE — Therapy (Signed)
?OUTPATIENT PHYSICAL THERAPY CERVICAL EVALUATION ? ? ?Patient Name: Cassandra Beltran ?MRN: [id number] ?DOB:2001-06-25, 21 y.o., female ?Today's Date: 01/17/2022 ? ? PT End of Session - 01/17/22 1536   ? ? Visit Number 1   ? Number of Visits 7   ? Date for PT Re-Evaluation 03/01/22   ? Authorization Type [employer]   ? PT Start Time 1450   ? PT Stop Time 1530   ? PT Time Calculation (min) 40 min   ? Activity Tolerance Patient tolerated treatment well   ? Behavior During Therapy [facility] for tasks assessed/performed   ? ?  ?  ? ?  ? ? ?Past Medical History:  ?Diagnosis Date  ? Ectopic pregnancy   ? 09/2021  ? Heart murmur   ? Migraine   ? ?History reviewed. No pertinent surgical history. ?There are no problems to display for this patient. ? ? ?PCP: Jackquline Denmark, NP ? ?REFERRING PROVIDER: Ocie Doyne, MD ? ?REFERRING DIAG: M54.2 (ICD-10-CM) - Cervicalgia  ? ?THERAPY DIAG:  ?Cervicalgia ? ?Cervicogenic headache ? ?Other symptoms and signs involving the musculoskeletal system ? ?ONSET DATE: Feb. 1, 2023 ? ?SUBJECTIVE:                                                                                                                                                                                                        ? ?SUBJECTIVE STATEMENT: ?Pt reports she has had migraines and neck pain since middle school; reports pain has been getting worse; states she has minor HA right now; reports HA's worse on Rt side behind eye; reports she has light and sound and photosensivity ? ?PERTINENT HISTORY:  ?Heart murmur, migraines ? ?PAIN:  ?Are you having pain? Yes ?NPRS scale: 7/10 ?Pain location: bil. Posterior neck and upper trap ?Pain orientation: Bilateral and Upper  ?PAIN TYPE: pressure - feels like something is weighing on it ?Pain description: constant  ?Aggravating factors: unknown ?Relieving factors: takes migraine medication only - no other relieving factors ? ?PRECAUTIONS: None ? ?WEIGHT BEARING RESTRICTIONS  No ? ?FALLS:  ?Has patient fallen in last 6 months? No ? ?LIVING ENVIRONMENT: ?Lives with: lives with their family and lives with roommate on campus ?Lives in: House/apartment ?Has following equipment at home: None ? ?OCCUPATION: student full time at [employer]  ? ?PLOF: Independent ? ?PATIENT GOALS reduce pain in neck ? ?OBJECTIVE:  ? ? ?COGNITION: ?Overall cognitive status: Within functional limits for tasks assessed ?  ? ?PALPATION: ?Tender with deep palpation to bil. Upper traps  ? ?CERVICAL AROM/PROM ? ?A/PROM A/PROM (  deg) ?01/17/2022  ?Flexion 45 with pain   ?Extension 35 with pain  ?Right lateral flexion 40 w/pain  ?Left lateral flexion 40 w/pain  ?Right rotation 64   ?Left rotation 42 ;  ? (Blank rows = not tested) ? ?UE AROM/PROM:  Grossly tested WNL's ? ? ?UE MMT: Grossly WNL's  ? ? ?TODAY'S TREATMENT:  ?[facility] ? ? ?PATIENT EDUCATION:  ?Education details: [facility] [id number] ?Person educated: Patient ?Education method: Explanation, Demonstration, and Handouts ?Education comprehension: verbalized understanding, returned demonstration, and needs further education ? ? ?HOME EXERCISE PROGRAM: ?[id number] Medbridge ? ?ASSESSMENT: ? ?CLINICAL IMPRESSION: ?Patient is a 21 y.o. female who was seen today for physical therapy evaluation and treatment for cervicalgia.  ? ? ?OBJECTIVE IMPAIRMENTS decreased ROM, increased fascial restrictions, increased muscle spasms, impaired flexibility, postural dysfunction, and pain.  ? ?ACTIVITY LIMITATIONS driving, occupation, school, and personal finances.  ? ?PERSONAL FACTORS 1 comorbidity: Cervical pain  are also affecting patient's functional outcome.  ? ? ?REHAB POTENTIAL: Good ? ?CLINICAL DECISION MAKING: Stable/uncomplicated ? ?EVALUATION COMPLEXITY: Low ? ? ?GOALS: ?Goals reviewed with patient? Yes ? ?SHORT TERM GOALS: ? ?Independent in HEP for cervical ROM and stretching. ?Baseline: Dependent ?Target date: 02/07/2022 ?Goal status: INITIAL ? ?2.  Pt will report decreased pain in  bil. Upper trap regions to </= 5/10 for increased independence and ease with ADL's and school work. ?Baseline: 7/10 ?Target date: 02/07/2022 ?Goal status: INITIAL ? ?3.  Increase cervical rotation to left side to >/= 52 degrees for increased independence with driving. ?Baseline: 42 degrees Lt cervical rotation ?Target date: 02/07/2022 ?Goal status: INITIAL ? ?LONG TERM GOALS: ? ?Independent in updated HEP for cervical AROM & stretching. ?Baseline: Dependent ?Target date: 02/28/2022 ?Goal status: INITIAL ? ?2.  Report decreased pain in bil. Upper traps to </= 3/10 intensity for increased independence with driving, visual scanning and academic work. ?Baseline: 7/10 ?Target date: 02/28/2022 ?Goal status: INITIAL ? ?3.  Pt will report at least 25% improvement in headaches ?Baseline: Pt reports slight headache at time of eval with intermittent migraines ?Target date: 02/28/2022 ?Goal status: INITIAL ? ?4.  Pt will increase cervical AROM to WNL's for rotation Rt and Lt and lateral flexion. ?          Baseline:  Rt Rotation 64 degrees, Lt rotation 42; lateral flexion 40 bil. sides   ? Target date:  02/28/2022 ?                   Goal status:  INITIAL ? ?PLAN: ?PT FREQUENCY: 1x/week ? ?PT DURATION: 6 weeks ? ?PLANNED INTERVENTIONS: Therapeutic exercises, Therapeutic activity, Neuromuscular re-education, Patient/Family education, Joint manipulation, Joint mobilization, Dry Needling, Electrical stimulation, Spinal manipulation, Spinal mobilization, Cryotherapy, Moist heat, Traction, Ultrasound, and Manual therapy ? ?PLAN FOR NEXT SESSION: Review HEP prn (Medbridge), manual therapy for bil. Upper traps & cervical spine, modalities prn ? ? ?CXKGYJ, EHUDJ SHFWYOV, PT ?01/17/2022, 3:39 PM ? ? ? ? Pt  ?

## 2022-01-31 ENCOUNTER — Ambulatory Visit: Payer: BC Managed Care – PPO

## 2022-02-07 ENCOUNTER — Other Ambulatory Visit: Payer: Self-pay

## 2022-02-07 ENCOUNTER — Ambulatory Visit: Payer: BC Managed Care – PPO

## 2022-02-07 DIAGNOSIS — M542 Cervicalgia: Secondary | ICD-10-CM

## 2022-02-07 NOTE — Patient Instructions (Signed)
?  Access Code: [email or web address] ?URL: [email or web address] ?Date: 02/07/2022 ?Prepared by: Cherly Anderson ? ?Exercises ?- Seated Assisted Cervical Rotation with Towel  - 1 x daily - 7 x weekly - 1 sets - 2 reps - 20 hold ?- Cervical Extension AROM with Strap  - 1 x daily - 7 x weekly - 1 sets - 2 reps - 10 hold ?- Seated Cervical Sidebending Stretch  - 1 x daily - 7 x weekly - 1 sets - 2 reps - 10-15 hold ?- Neck Flexion Stretch  - 1 x daily - 7 x weekly - 1 sets - 2 reps - 10-15 hold ?- Seated Cervical Retraction  - 3 x daily - 7 x weekly - 2 sets - 10 reps ?- Seated Scapular Retraction  - 3 x daily - 7 x weekly - 2 sets - 10 reps ? ?

## 2022-02-07 NOTE — Therapy (Signed)
?OUTPATIENT PHYSICAL THERAPY TREATMENT NOTE ? ? ?Patient Name: Cassandra Beltran ?MRN: [id number] ?DOB:May 17, 2001, 21 y.o., female ?Today's Date: 02/07/2022 ? ?PCP: Phill Mutter, NP ?REFERRING PROVIDER: Genia Harold, MD ? ? PT End of Session - 02/07/22 1150   ? ? Visit Number 2   ? Number of Visits 7   ? Date for PT Re-Evaluation 03/01/22   ? Authorization Type [employer]   ? PT Start Time 1149   ? PT Stop Time 1223   ? PT Time Calculation (min) 34 min   ? Activity Tolerance Patient tolerated treatment well   ? Behavior During Therapy [facility] for tasks assessed/performed   ? ?  ?  ? ?  ? ? ?Past Medical History:  ?Diagnosis Date  ? Ectopic pregnancy   ? 09/2021  ? Heart murmur   ? Migraine   ? ?History reviewed. No pertinent surgical history. ?There are no problems to display for this patient. ? ? ?REFERRING DIAG: M54.2 (ICD-10-CM) - Cervicalgia  ? ?THERAPY DIAG:  ?Cervicalgia ? ?PERTINENT HISTORY: Heart murmur, migraines ? ?PRECAUTIONS: none ? ?SUBJECTIVE: Pt reports that she has been trying to do the exercises other than when has migraines which is 2-3x/week. ? ?PAIN:  ?Are you having pain? Yes: NPRS scale: 2/10 ?Pain location: right neck ?Pain description: aching, tightness ?Aggravating factors: nothing specific ?Relieving factors: stretching at times ? ? ? ? ?TODAY'S TREATMENT:  ? Prone: Assessed thoracic and cervical passive accessory movements. Pt had some hypomobility in upper thoracic. Pain with central and unilateral PA C4-7 area. STM to upper/mid trap and rhomboid area with trigger point release x 8 min.  ? Supine: suboccipital release with cues to relax neck x 30 sec. Passive upper trap stretch to each side 30 sec x 2. Passive right cervical rotation with lateral glides at end range on left C3-5 grade 2 2 bouts of 20 sec. Pt reported good stretch feeling. Good left passive rotation. ? Cervical retraction x 10 in supine with verbal cues for form. Cervical retraction seated with verbal and tactile  cues. Cuing to chin tuck some seemed to help with form. Seated bilateral scapular retraction x 10 with verbal and tactile cues for form to relax upper traps. ?  ?Pt reports feels less tightness after manual techniques ?  ?  ?PATIENT EDUCATION:  ?Education details: Added cervical retraction and scapular retraction to HEP ?Person educated: Patient ?Education method: Explanation, Demonstration, and Handouts ?Education comprehension: verbalized understanding, returned demonstration, and needs further education ?  ?  ?HOME EXERCISE PROGRAM: ?[place] ?  ?ASSESSMENT: ?  ?CLINICAL IMPRESSION: ?PT focused more on manual techniques for spinal mobility and soft tissue today. Pt reported feeling less tight at end of session. ?  ?  ?OBJECTIVE IMPAIRMENTS decreased ROM, increased fascial restrictions, increased muscle spasms, impaired flexibility, postural dysfunction, and pain.  ?  ?ACTIVITY LIMITATIONS driving, occupation, school, and personal finances.  ?  ?PERSONAL FACTORS 1 comorbidity: Cervical pain  are also affecting patient's functional outcome.  ?  ?  ?REHAB POTENTIAL: Good ?  ?CLINICAL DECISION MAKING: Stable/uncomplicated ?  ?EVALUATION COMPLEXITY: Low ?  ?  ?GOALS: ?Goals reviewed with patient? Yes ?  ?SHORT TERM GOALS: ?  ?Independent in HEP for cervical ROM and stretching. ?Baseline: Dependent ?Target date: 02/07/2022 ?Goal status: INITIAL ?  ?2.  Pt will report decreased pain in bil. Upper trap regions to </= 5/10 for increased independence and ease with ADL's and school work. ?Baseline: 7/10 ?Target date: 02/07/2022 ?Goal status: INITIAL ?  ?  3.  Increase cervical rotation to left side to >/= 52 degrees for increased independence with driving. ?Baseline: 42 degrees Lt cervical rotation ?Target date: 02/07/2022 ?Goal status: INITIAL ?  ?LONG TERM GOALS: ?  ?Independent in updated HEP for cervical AROM & stretching. ?Baseline: Dependent ?Target date: 02/28/2022 ?Goal status: INITIAL ?  ?2.  Report decreased  pain in bil. Upper traps to </= 3/10 intensity for increased independence with driving, visual scanning and academic work. ?Baseline: 7/10 ?Target date: 02/28/2022 ?Goal status: INITIAL ?  ?3.  Pt will report at least 25% improvement in headaches ?Baseline: Pt reports slight headache at time of eval with intermittent migraines ?Target date: 02/28/2022 ?Goal status: INITIAL ?  ?4.  Pt will increase cervical AROM to WNL's for rotation Rt and Lt and lateral flexion. ?          Baseline:  Rt Rotation 64 degrees, Lt rotation 42; lateral flexion 40 bil. sides                ? Target date:  02/28/2022 ?                   Goal status:  INITIAL ?  ?PLAN: ?PT FREQUENCY: 1x/week ?  ?PT DURATION: 6 weeks ?  ?PLANNED INTERVENTIONS: Therapeutic exercises, Therapeutic activity, Neuromuscular re-education, Patient/Family education, Joint manipulation, Joint mobilization, Dry Needling, Electrical stimulation, Spinal manipulation, Spinal mobilization, Cryotherapy, Moist heat, Traction, Ultrasound, and Manual therapy ?  ?PLAN FOR NEXT SESSION: Review HEP prn (Medbridge), manual therapy for bil. Upper traps, cervical spine, thoracic mobs. Postural exercises. ?  ? ? ?Electa Sniff, PT, DPT, NCS ?02/07/2022, 1:23 PM ? ?  ? ?

## 2022-02-11 ENCOUNTER — Other Ambulatory Visit: Payer: Self-pay

## 2022-02-11 ENCOUNTER — Ambulatory Visit: Payer: BC Managed Care – PPO

## 2022-02-11 DIAGNOSIS — R29898 Other symptoms and signs involving the musculoskeletal system: Secondary | ICD-10-CM

## 2022-02-11 DIAGNOSIS — M542 Cervicalgia: Secondary | ICD-10-CM

## 2022-02-11 NOTE — Patient Instructions (Signed)
Access Code: [email or web address] ?URL: [email or web address] ?Date: 02/11/2022 ?Prepared by: Jethro Bastos ? ?Exercises ?- Seated Assisted Cervical Rotation with Towel  - 1 x daily - 7 x weekly - 1 sets - 2 reps - 20 hold ?- Cervical Extension AROM with Strap  - 1 x daily - 7 x weekly - 1 sets - 2 reps - 10 hold ?- Seated Cervical Sidebending Stretch  - 1 x daily - 7 x weekly - 1 sets - 2 reps - 10-15 hold ?- Neck Flexion Stretch  - 1 x daily - 7 x weekly - 1 sets - 2 reps - 10-15 hold ?- Seated Cervical Retraction  - 3 x daily - 7 x weekly - 2 sets - 10 reps ?- Scapular Retraction with Resistance  - 2 x daily - 7 x weekly - 2 sets - 10 reps ?

## 2022-02-11 NOTE — Therapy (Signed)
?OUTPATIENT PHYSICAL THERAPY TREATMENT NOTE ? ? ?Patient Name: Cassandra Beltran ?MRN: [id number] ?DOB:05/22/2001, 21 y.o., female ?Today's Date: 02/11/2022 ? ?PCP: Phill Mutter, NP ?REFERRING PROVIDER: Genia Harold, MD ? ? PT End of Session - 02/11/22 1402   ? ? Visit Number 3   ? Number of Visits 7   ? Date for PT Re-Evaluation 03/01/22   ? Authorization Type [employer]   ? PT Start Time 1402   ? PT Stop Time 1441   ? PT Time Calculation (min) 39 min   ? Activity Tolerance Patient tolerated treatment well   ? Behavior During Therapy [facility] for tasks assessed/performed   ? ?  ?  ? ?  ? ? ?Past Medical History:  ?Diagnosis Date  ? Ectopic pregnancy   ? 09/2021  ? Heart murmur   ? Migraine   ? ?History reviewed. No pertinent surgical history. ?There are no problems to display for this patient. ? ? ?REFERRING DIAG: M54.2 (ICD-10-CM) - Cervicalgia  ? ?THERAPY DIAG:  ?Cervicalgia ? ?Other symptoms and signs involving the musculoskeletal system ? ?PERTINENT HISTORY: Heart murmur, migraines ? ?PRECAUTIONS: none ? ?SUBJECTIVE: Patient reports that she had been doing well, reports had instance of sharp pain near R mid scapulae region that lasted some time but then went away. Pt reports migraine approx 2 days ago.  ? ?PAIN:  ?Are you having pain? No ? ? ? ? ?TODAY'S TREATMENT:  ? ?Cervical AROM/PROM: ?CERVICAL AROM/PROM ?  ?A/PROM A/PROM (Deg) ?02/11/2022 A/PROM (deg) ?01/17/2022  ?Flexion 48 (mild tension noted) 45 with pain   ?Extension 37 35 with pain  ?Right lateral flexion 38 (mild tension, sharp pain) 40 w/pain  ?Left lateral flexion 44  40 w/pain  ?Right rotation 63 64   ?Left rotation 50 42 ;  ? (Blank rows = not tested) ?  ?  Manual Therapy:  ?Prone: Completed STM to R rhomboids, no trigger points noted just increased muscle tension. PT demonstrating and patient returning demonstration of use of tennis ball for self STM to the affected area. PT Provided tennis ball for patient for home use. ?Supine: Completed  STM to R Upper Trap and R Levator Scap continue to progress into range as tolerated. Completed passive R upper trap stretch progressing into range 3 x 30 seconds. Completed R Upper Trap contract/relax x 3 reps progressing into range, no pain reported just good stretch sensation.  ? ? TherEx:  ?Seated bilateral scapular retraction x 10 reps with use of red therabands, proper form.  Educated patient how to perform seated/standing with use of door frame. Updated HEP and provided new handout.  ?  ? ?Bolded added to HEP on 02/11/22 ?Access Code: [email or web address] ?URL: [email or web address] ?Date: 02/11/2022 ?Prepared by: Baldomero Lamy ? ?Exercises ?- Seated Assisted Cervical Rotation with Towel  - 1 x daily - 7 x weekly - 1 sets - 2 reps - 20 hold ?- Cervical Extension AROM with Strap  - 1 x daily - 7 x weekly - 1 sets - 2 reps - 10 hold ?- Seated Cervical Sidebending Stretch  - 1 x daily - 7 x weekly - 1 sets - 2 reps - 10-15 hold ?- Neck Flexion Stretch  - 1 x daily - 7 x weekly - 1 sets - 2 reps - 10-15 hold ?- Seated Cervical Retraction  - 3 x daily - 7 x weekly - 2 sets - 10 reps ?- Scapular Retraction with Resistance  - 2 x daily - 7 x weekly -  2 sets - 10 reps ?  ?PATIENT EDUCATION:  ?Education details: Continue HEP; STM with tennis ball, progression of scap retraction  ?Person educated: Patient ?Education method: Explanation, Demonstration, and Handouts ?Education comprehension: verbalized understanding, returned demonstration, and needs further education ?  ?  ?HOME EXERCISE PROGRAM: ?[place] ?  ?ASSESSMENT: ?  ?CLINICAL IMPRESSION: ?Assessed progress toward STGs. Patient able to meet and/or partially meet all STGs. Patient demonstrating reduced frequency of pain, but continue to have times were pain is still 7/10 level. Patient demonstrating improved cervical ROM today with less pain than on initial evaluation. Rest of session focused on continued manual therapy and strengthening to  cervical/thoracic region. Will continue per POC.   ?  ?OBJECTIVE IMPAIRMENTS decreased ROM, increased fascial restrictions, increased muscle spasms, impaired flexibility, postural dysfunction, and pain.  ?  ?ACTIVITY LIMITATIONS driving, occupation, school, and personal finances.  ?  ?PERSONAL FACTORS 1 comorbidity: Cervical pain  are also affecting patient's functional outcome.  ?  ?  ?REHAB POTENTIAL: Good ?  ?CLINICAL DECISION MAKING: Stable/uncomplicated ?  ?EVALUATION COMPLEXITY: Low ?  ?  ?GOALS: ?Goals reviewed with patient? Yes ?  ?SHORT TERM GOALS: ?  ?Independent in HEP for cervical ROM and stretching. ?Baseline: Dependent; Reports independence completing every other day ?Target date: 02/07/2022 ?Goal status: MET ?  ?2.  Pt will report decreased pain in bil. Upper trap regions to </= 5/10 for increased independence and ease with ADL's and school work. ?Baseline: 7/10; reports has become less frequent, but does still have moments its at 5/10.  ?Target date: 02/07/2022 ?Goal status: PARTIALLY MET ?  ?3.  Increase cervical rotation to left side to >/= 52 degrees for increased independence with driving. ?Baseline: 42 degrees Lt cervical rotation; 50 rotations with Lt Cervical rotation ?Target date: 02/07/2022 ?Goal status: PARTIALLY MET ?  ?LONG TERM GOALS: ?  ?Independent in updated HEP for cervical AROM & stretching. ?Baseline: Dependent ?Target date: 02/28/2022 ?Goal status: INITIAL ?  ?2.  Report decreased pain in bil. Upper traps to </= 3/10 intensity for increased independence with driving, visual scanning and academic work. ?Baseline: 7/10 ?Target date: 02/28/2022 ?Goal status: INITIAL ?  ?3.  Pt will report at least 25% improvement in headaches ?Baseline: Pt reports slight headache at time of eval with intermittent migraines ?Target date: 02/28/2022 ?Goal status: INITIAL ?  ?4.  Pt will increase cervical AROM to WNL's for rotation Rt and Lt and lateral flexion. ?          Baseline:  Rt Rotation 64 degrees,  Lt rotation 42; lateral flexion 40 bil. sides                ? Target date:  02/28/2022 ?                   Goal status:  INITIAL ?  ?PLAN: ?PT FREQUENCY: 1x/week ?  ?PT DURATION: 6 weeks ?  ?PLANNED INTERVENTIONS: Therapeutic exercises, Therapeutic activity, Neuromuscular re-education, Patient/Family education, Joint manipulation, Joint mobilization, Dry Needling, Electrical stimulation, Spinal manipulation, Spinal mobilization, Cryotherapy, Moist heat, Traction, Ultrasound, and Manual therapy ?  ?PLAN FOR NEXT SESSION: How was HEP Update? Did we try tennis ball? Review HEP prn (Medbridge), manual therapy for bil. Upper traps, cervical spine, thoracic mobs. Postural exercises. ?  ? ? ?Jones Bales, PT, DPT ?02/11/2022, 2:44 PM ? ?  ? ?

## 2022-02-19 ENCOUNTER — Ambulatory Visit: Payer: BC Managed Care – PPO | Attending: Nurse Practitioner

## 2022-02-19 DIAGNOSIS — M542 Cervicalgia: Secondary | ICD-10-CM | POA: Diagnosis present

## 2022-02-19 NOTE — Therapy (Signed)
?OUTPATIENT PHYSICAL THERAPY TREATMENT NOTE/Discharge Summary ? ? ?Patient Name: Cassandra Beltran ?MRN: [id number] ?DOB:Dec 01, 2000, 21 y.o., female ?Today's Date: 02/19/2022 ? ?PHYSICAL THERAPY DISCHARGE SUMMARY ? ?Visits from Start of Care: 4 ? ?Current functional level related to goals / functional outcomes: ?See clinical impression and goals for more information. ?  ?Remaining deficits: ?migraines ?  ?Education / Equipment: ?HEP  ? ?Patient agrees to discharge. Patient goals were partially met. Patient is being discharged due to meeting the stated rehab goals. ? ? ?PCP: Phill Mutter, NP ?REFERRING PROVIDER: Genia Harold, MD ? ? PT End of Session - 02/19/22 1318   ? ? Visit Number 4   ? Number of Visits 7   ? Date for PT Re-Evaluation 03/01/22   ? Authorization Type [employer]   ? PT Start Time 1316   ? PT Stop Time 9509   discharge  ? PT Time Calculation (min) 23 min   ? Activity Tolerance Patient tolerated treatment well   ? Behavior During Therapy [facility] for tasks assessed/performed   ? ?  ?  ? ?  ? ? ?Past Medical History:  ?Diagnosis Date  ? Ectopic pregnancy   ? 09/2021  ? Heart murmur   ? Migraine   ? ?History reviewed. No pertinent surgical history. ?There are no problems to display for this patient. ? ? ?REFERRING DIAG: M54.2 (ICD-10-CM) - Cervicalgia  ? ?THERAPY DIAG:  ?Cervicalgia ? ?PERTINENT HISTORY: Heart murmur, migraines ? ?PRECAUTIONS: none ? ?SUBJECTIVE: Pt reports that she has been doing good. Had a migraine yesterday and she had her injection for the first time.  ? ?PAIN:  ?Are you having pain? No ? ? ? ? ?TODAY'S TREATMENT:  ?02/19/22: ?PT reviewed HEP as noted below. Pt denies any questions on the stretches. Performed seated cervical retraction x 10.  Seated bilateral scapular retraction with RTB x 10, bilateral scapular retraction with RTB x 10, standing at walll sliding arms up and then back down focusing on relaxing upper trap and engaging lower trap x 10. Verbal and tactile cues for  form. ?Cervical AROM/PROM: ?CERVICAL AROM/PROM ?  ?A/PROM A/PROM (Deg) ?02/11/2022 A/PROM (deg) ?01/17/2022 AROM (degrees) ?02/19/22  ?Flexion 48 (mild tension noted) 45 with pain    ?Extension 37 35 with pain   ?Right lateral flexion 38 (mild tension, sharp pain) 40 w/pain 45 (slight pinch going to the right on right)  ?Left lateral flexion 44  40 w/pain 45  ?Right rotation 63 64  60  ?Left rotation 50 42 ; 70  ? (Blank rows = not tested) ?  ?    ? ?Bolded added to HEP on 02/19/22 ?Access Code: [email or web address] ?URL: [email or web address] ?Date: 02/19/2022 ?Prepared by: Cherly Anderson ? ?Exercises ?- Seated Assisted Cervical Rotation with Towel  - 1 x daily - 7 x weekly - 1 sets - 2 reps - 20 hold ?- Cervical Extension AROM with Strap  - 1 x daily - 7 x weekly - 1 sets - 2 reps - 10 hold ?- Seated Cervical Sidebending Stretch  - 1 x daily - 7 x weekly - 1 sets - 2 reps - 10-15 hold ?- Neck Flexion Stretch  - 1 x daily - 7 x weekly - 1 sets - 2 reps - 10-15 hold ?- Seated Cervical Retraction  - 3 x daily - 7 x weekly - 2 sets - 10 reps ?- Scapular Retraction with Resistance  - 2 x daily - 7 x weekly - 2 sets - 10 reps ?-  Standing Shoulder External Rotation with Resistance  - 2 x daily - 7 x weekly - 2 sets - 10 reps ?- Low Trap Setting at [place] 2 x daily - 7 x weekly - 2 sets - 10 reps ?  ?PATIENT EDUCATION:  ?Education details: Updated HEP, discharge plan  ?Person educated: Patient ?Education method: Explanation, Demonstration, and Handouts ?Education comprehension: verbalized understanding, returned demonstration ?  ?  ?HOME EXERCISE PROGRAM: ?[place] ?  ?ASSESSMENT: ?  ?CLINICAL IMPRESSION: ?PT assessed LTGs today as pt reports she is doing well. Met/partially met 3/4 goals. She still has her migraines but was started on new medication for that this week. Pt reporting decreased pain in neck and upper back. Pt showed improvement in cervical ROM and has been performing cervical stretches and postural  exercises for HEP. Denies any questions. PT will discharge to home program at this time.   ?  ?OBJECTIVE IMPAIRMENTS decreased ROM, increased fascial restrictions, increased muscle spasms, impaired flexibility, postural dysfunction, and pain.  ?  ?ACTIVITY LIMITATIONS driving, occupation, school, and personal finances.  ?  ?PERSONAL FACTORS 1 comorbidity: Cervical pain  are also affecting patient's functional outcome.  ?  ?  ?REHAB POTENTIAL: Good ?  ?CLINICAL DECISION MAKING: Stable/uncomplicated ?  ?EVALUATION COMPLEXITY: Low ?  ?  ?GOALS: ?Goals reviewed with patient? Yes ?  ?SHORT TERM GOALS: ?  ?Independent in HEP for cervical ROM and stretching. ?Baseline: Dependent; Reports independence completing every other day ?Target date: 02/07/2022 ?Goal status: MET ?  ?2.  Pt will report decreased pain in bil. Upper trap regions to </= 5/10 for increased independence and ease with ADL's and school work. ?Baseline: 7/10; reports has become less frequent, but does still have moments its at 5/10.  ?Target date: 02/07/2022 ?Goal status: PARTIALLY MET ?  ?3.  Increase cervical rotation to left side to >/= 52 degrees for increased independence with driving. ?Baseline: 42 degrees Lt cervical rotation; 50 rotations with Lt Cervical rotation ?Target date: 02/07/2022 ?Goal status: PARTIALLY MET ?  ?LONG TERM GOALS: ?  ?Independent in updated HEP for cervical AROM & stretching. ?Baseline: Dependent ?Target date: 02/28/2022 ?Goal status: met ?  ?2.  Report decreased pain in bil. Upper traps to </= 3/10 intensity for increased independence with driving, visual scanning and academic work. ?Baseline: 7/10. 02/19/22 4-5/10 at worst in last week, 0/10 currently. Pt reports that the exercises also help with the pain. ?Target date: 02/28/2022 ?Goal status: partially met ?  ?3.  Pt will report at least 25% improvement in headaches ?Baseline: Pt reports slight headache at time of eval with intermittent migraines ?Baseline: Pt reports that  headaches are still the same but is trying a new injection medication as of yesterday. ?Target date: 02/28/2022 ?Goal status: not met ?  ?4.  Pt will increase cervical AROM to WNL's for rotation Rt and Lt and lateral flexion. ?          Baseline:  Rt Rotation 64 degrees, Lt rotation 42; lateral flexion 40 bil. sides . 02/19/22 lateral flexion 45 degrees bilateral, right rotation=60, left rotation=70. WFL with only slight pinch with right lateral flexion             ? Target date:  02/28/2022 ?                   Goal status:  partially met ?  ?PLAN: ?PT FREQUENCY: 1x/week ?  ?PT DURATION: 6 weeks ?  ?PLANNED INTERVENTIONS: Therapeutic exercises, Therapeutic activity,  Neuromuscular re-education, Patient/Family education, Joint manipulation, Joint mobilization, Dry Needling, Electrical stimulation, Spinal manipulation, Spinal mobilization, Cryotherapy, Moist heat, Traction, Ultrasound, and Manual therapy ?  ?PLAN FOR NEXT SESSION: Discharge today ? ? ?Electa Sniff, PT, DPT, NCS ?02/19/2022, 1:49 PM ? ?  ? ?

## 2022-02-19 NOTE — Patient Instructions (Signed)
Access Code: [email or web address] ?URL: [email or web address] ?Date: 02/19/2022 ?Prepared by: Elmer Bales ? ?Exercises ?- Seated Assisted Cervical Rotation with Towel  - 1 x daily - 7 x weekly - 1 sets - 2 reps - 20 hold ?- Cervical Extension AROM with Strap  - 1 x daily - 7 x weekly - 1 sets - 2 reps - 10 hold ?- Seated Cervical Sidebending Stretch  - 1 x daily - 7 x weekly - 1 sets - 2 reps - 10-15 hold ?- Neck Flexion Stretch  - 1 x daily - 7 x weekly - 1 sets - 2 reps - 10-15 hold ?- Seated Cervical Retraction  - 3 x daily - 7 x weekly - 2 sets - 10 reps ?- Scapular Retraction with Resistance  - 2 x daily - 7 x weekly - 2 sets - 10 reps ?- Standing Shoulder External Rotation with Resistance  - 2 x daily - 7 x weekly - 2 sets - 10 reps ?- Low Trap Setting at Wall  - 2 x daily - 7 x weekly - 2 sets - 10 reps ?

## 2022-02-26 ENCOUNTER — Ambulatory Visit: Payer: Medicaid Other

## 2022-03-05 ENCOUNTER — Ambulatory Visit: Payer: Medicaid Other

## 2022-05-02 ENCOUNTER — Encounter: Payer: Self-pay | Admitting: Psychiatry

## 2022-05-02 ENCOUNTER — Ambulatory Visit (INDEPENDENT_AMBULATORY_CARE_PROVIDER_SITE_OTHER): Payer: BC Managed Care – PPO | Admitting: Psychiatry

## 2022-05-02 VITALS — BP 116/71 | HR 67 | Ht 63.0 in | Wt 104.8 lb

## 2022-05-02 DIAGNOSIS — G43119 Migraine with aura, intractable, without status migrainosus: Secondary | ICD-10-CM

## 2022-05-02 MED ORDER — EMGALITY 120 MG/ML ~~LOC~~ SOAJ: 1.0000 mL | SUBCUTANEOUS | 11 refills | Status: DC

## 2022-05-02 MED ORDER — DICLOFENAC POTASSIUM 50 MG PO TABS: ORAL_TABLET | ORAL | 11 refills | Status: AC

## 2022-05-02 NOTE — Progress Notes (Addendum)
   CC:  headaches  Follow-up Visit  Last visit: 01/04/22  Brief HPI: 21 year old female without significant medical history who follows in clinic for migraines.  At her last visit she was started on amitriptyline for headache prevention and Maxalt for rescue. She was referred to neck PT for cervicalgia.  Interval History: She was unable to tolerate amitriptyline so her PCP stopped it and started her on Emgality. She has had 3 doses at this point and her migraine frequency has improved. Had 1-2 migraines in the past month. Emgality has decreased her use of rescue medication per month from 8 pills to 2 pills. Maxalt knocked her out and she is not sure if it helped.   She went to neck PT which has helped reduce her neck pain.  Headache days per month: 2 Headache free days per month: 28  Current Headache Regimen: Preventative: Emgality 120 mg monthly Abortive: Maxalt 10 mg PRN  Prior Therapies                                  Amitriptyline 25 mg QHS Emgality 120 mg monthly Maxalt 10 mg PRN Tylenol Goody powder  Physical Exam:   Vital Signs: BP 116/71   Pulse 67   Ht 5\' 3"  (1.6 m)   Wt 104 lb 12.8 oz (47.5 kg)   LMP 09/08/2021   BMI 18.56 kg/m  GENERAL:  well appearing, in no acute distress, alert  SKIN:  Color, texture, turgor normal. No rashes or lesions HEAD:  Normocephalic/atraumatic. RESP: normal respiratory effort MSK:  No gross joint deformities.   NEUROLOGICAL: Mental Status: Alert, oriented to person, place and time, Follows commands, and Speech fluent and appropriate. Cranial Nerves: PERRL, face symmetric, no dysarthria, hearing grossly intact Motor: moves all extremities equally Gait: normal-based.  IMPRESSION: 21 year old female without significant medical history who presents for follow up of migraines. Headaches have decreased with Emgality, however she does not have a good rescue medication to take at migraine onset. Maxalt caused drowsiness so she has  not been taking it. Will start diclofenac for migraine rescue.  PLAN: -Prevention: Continue Emgality 120 mg monthly -Rescue: Start diclofenac 50-100 mg as needed for migraine  Follow-up: 6 months  I spent a total of 13 minutes on the date of the service. Headache education was done. Discussed treatment options including preventive and acute medications, natural supplements, and infusion therapy. Discussed medication side effects, adverse reactions and drug interactions. Written educational materials and patient instructions outlining all of the above were given.  Ocie Doyne, MD 05/02/22 11:10 AM

## 2022-05-06 ENCOUNTER — Telehealth: Payer: Self-pay | Admitting: *Deleted

## 2022-05-06 NOTE — Telephone Encounter (Signed)
Emgality PA, [place]  [id number], [id number]. PA sent to plan. Please wait for [facility] to return a determination

## 2022-05-08 NOTE — Telephone Encounter (Signed)
Received fax from [facility] asking for clinicals to show continuation of therapy. Papers on MD desk for signature.

## 2022-05-08 NOTE — Telephone Encounter (Signed)
[employer] form signed by MD, faxed with last clinical notes to [facility]. Received confirmation.

## 2022-05-27 ENCOUNTER — Encounter: Payer: Self-pay | Admitting: *Deleted

## 2022-05-27 NOTE — Telephone Encounter (Signed)
Called [employer] to check emgality appeal. Spoke with Margaretmary Dys, emgality still denied. She stated appeal notes were received after med was denied so did not meet requirement of using less rescue meds with emgality despite that being noted in office notes from 05/02/22. Spoke with [facility] pharmacist for peer to peer. Emgality approved x one year.

## 2022-11-05 ENCOUNTER — Ambulatory Visit: Payer: Medicaid Other | Admitting: Psychiatry

## 2022-11-05 VITALS — BP 112/78 | HR 68 | Ht 63.0 in | Wt 111.8 lb

## 2022-11-05 DIAGNOSIS — G43119 Migraine with aura, intractable, without status migrainosus: Secondary | ICD-10-CM

## 2022-11-05 MED ORDER — EMGALITY 120 MG/ML ~~LOC~~ SOAJ: 1.0000 | SUBCUTANEOUS | 5 refills | Status: AC

## 2022-11-05 MED ORDER — EMGALITY 120 MG/ML ~~LOC~~ SOAJ: 2.0000 | Freq: Once | SUBCUTANEOUS | 0 refills | Status: AC

## 2022-11-05 MED ORDER — NARATRIPTAN HCL 2.5 MG PO TABS: 2.5000 mg | ORAL_TABLET | ORAL | 6 refills | Status: AC | PRN

## 2022-11-05 MED ORDER — KETOROLAC TROMETHAMINE 60 MG/2ML IM SOLN
60.0000 mg | Freq: Once | INTRAMUSCULAR | Status: AC
  Administered 2022-11-05: 60 mg via INTRAMUSCULAR

## 2022-11-05 NOTE — Progress Notes (Signed)
Pt was giving 60 mg IM Toradol. Given in Rt Ventrogluteal and pt tolerated well.

## 2022-11-05 NOTE — Patient Instructions (Signed)
Start naratriptan as needed for migraine. Take at the onset of migraine. If headache recurs or does not fully resolve, you may take a second dose after 4 hours. Max dose 2 pills in 24 hours  We will try to get Emgality approved by your insurance

## 2022-11-05 NOTE — Progress Notes (Signed)
   CC:  headaches  Follow-up Visit  Last visit: 05/02/22  Brief HPI: 21 year old female without significant medical history who follows in clinic for migraines.   At her last visit she was continued on Emgality for prevention and started on diclofenac for rescue.  Interval History: She has had 5 migraines in the past month. Emgality was discontinued due to loss of insurance coverage. She is unsure why her insurance stopped covering it. Her PCP prescribed propranolol 2-3 months ago, but this has not made much of a difference. She is only taking it as needed right now because it makes her drowsy. Diclofenac made her nauseated.   Migraine days per month: 5 Headache free days per month: 25  Current Headache Regimen: Preventative: propranolol Abortive: none   Prior Therapies                                  Amitriptyline 25 mg QHS - side effects Propranolol - drowsiness Emgality 120 mg monthly Maxalt 10 mg PRN - drowsiness Tylenol Goody powder Diclofenac - nausea  Physical Exam:   Vital Signs: BP 112/78   Pulse 68   Ht 5\' 3"  (1.6 m)   Wt 111 lb 12.8 oz (50.7 kg)   BMI 19.80 kg/m  GENERAL:  well appearing, in no acute distress, alert  SKIN:  Color, texture, turgor normal. No rashes or lesions HEAD:  Normocephalic/atraumatic. RESP: normal respiratory effort MSK:  No gross joint deformities.   NEUROLOGICAL: Mental Status: Alert, oriented to person, place and time, Follows commands, and Speech fluent and appropriate. Cranial Nerves: PERRL, face symmetric, no dysarthria, hearing grossly intact Motor: moves all extremities equally Gait: normal-based.  IMPRESSION: 21 year old female who presents for follow up of migraines. She had done well until she had to stop 26 for insurance reasons. She has not had any improvement with propranolol and it makes her drowsy. Will attempt to restart Emgality as she has now failed both propranolol and amitripytline for migraine  prevention. Will start naratriptan for migraine rescue.  PLAN: -Prevention: Will attempt to restart Emgality as this has worked well for her previously -Rescue: Start naratriptan 2.5 mg PRN -Next steps: consider Nurtec or Ubrelvy for rescue   Follow-up: 6 months  I spent a total of 17 minutes on the date of the service. Headache education was done. Discussed treatment options including preventive and acute medications. Discussed medication side effects, adverse reactions and drug interactions. Written educational materials and patient instructions outlining all of the above were given.  [employer], MD 11/05/22 2:46 PM

## 2022-11-12 ENCOUNTER — Telehealth: Payer: Self-pay | Admitting: Psychiatry

## 2022-11-12 NOTE — Telephone Encounter (Signed)
Pt is calling. Stated medication Galcanezumab-gnlm (EMGALITY) 120 MG/ML SOAJ  and naratriptan (AMERGE) 2.5 MG tablet  need a PA sent to her insurance.

## 2022-11-14 NOTE — Telephone Encounter (Signed)
PA submitted for the pt for the Emgality [id number] Will await determination

## 2022-11-19 NOTE — Telephone Encounter (Signed)
PA was approved for the patient from 11/19/22 until 02/18/23 through [employer].

## 2022-11-19 NOTE — Telephone Encounter (Signed)
Received a questionnaire on 11/15/22 when we were out of the office. They denied the medication over the holidays due to [place] not being in the office. Attempting to submit the documentation that they are asking for rather than completing appeal process. If they don't allow that,then we will have to appeal.  Received confirmation that fax went through.

## 2023-05-19 ENCOUNTER — Emergency Department
Admission: EM | Admit: 2023-05-19 | Discharge: 2023-05-19 | Disposition: A | Discharge status: Home / Self Care | Payer: Medicaid Other | Attending: Emergency Medicine | Admitting: Emergency Medicine

## 2023-05-19 ENCOUNTER — Encounter: Payer: Self-pay | Admitting: Emergency Medicine

## 2023-05-19 ENCOUNTER — Other Ambulatory Visit: Payer: Self-pay

## 2023-05-19 DIAGNOSIS — G43101 Migraine with aura, not intractable, with status migrainosus: Secondary | ICD-10-CM | POA: Diagnosis not present

## 2023-05-19 DIAGNOSIS — R110 Nausea: Secondary | ICD-10-CM | POA: Diagnosis present

## 2023-05-19 LAB — POC URINE PREG, ED: Preg Test, Ur: NEGATIVE

## 2023-05-19 MED ORDER — KETOROLAC TROMETHAMINE 30 MG/ML IJ SOLN
30.0000 mg | Freq: Once | INTRAMUSCULAR | Status: AC
  Administered 2023-05-19: 30 mg via INTRAVENOUS
  Filled 2023-05-19: qty 1

## 2023-05-19 MED ORDER — SODIUM CHLORIDE 0.9 % IV SOLN
12.5000 mg | Freq: Four times a day (QID) | INTRAVENOUS | Status: DC | PRN
  Filled 2023-05-19: qty 0.5

## 2023-05-19 MED ORDER — SODIUM CHLORIDE 0.9 % IV BOLUS
1000.0000 mL | Freq: Once | INTRAVENOUS | Status: AC
  Administered 2023-05-19: 1000 mL via INTRAVENOUS

## 2023-05-19 MED ORDER — METOCLOPRAMIDE HCL 5 MG/ML IJ SOLN
10.0000 mg | Freq: Once | INTRAMUSCULAR | Status: AC
  Administered 2023-05-19: 10 mg via INTRAVENOUS
  Filled 2023-05-19: qty 2

## 2023-05-19 MED ORDER — DIPHENHYDRAMINE HCL 50 MG/ML IJ SOLN
12.5000 mg | Freq: Once | INTRAMUSCULAR | Status: AC
  Administered 2023-05-19: 12.5 mg via INTRAVENOUS
  Filled 2023-05-19: qty 1

## 2023-05-19 MED ORDER — SUMATRIPTAN SUCCINATE 6 MG/0.5ML ~~LOC~~ SOLN
6.0000 mg | Freq: Once | SUBCUTANEOUS | Status: AC
  Administered 2023-05-19: 6 mg via SUBCUTANEOUS
  Filled 2023-05-19: qty 0.5

## 2023-05-19 NOTE — Discharge Instructions (Signed)
Please follow-up with your primary care doctor to discuss potential medication changes.  You can return to the ED with new or worsening symptoms.

## 2023-05-19 NOTE — ED Triage Notes (Signed)
Pt has had a migraine for the past 5 days. Pt states she now has blurred vision and fatigue.

## 2023-05-19 NOTE — ED Notes (Addendum)
See triage note States she developed migraine h/a about 5 days ago  Has tried OTC meds w/o relief. 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

## 2023-05-19 NOTE — ED Provider Notes (Signed)
[facility] Provider Note    Event Date/Time   First MD Initiated Contact with Patient 05/19/23 1651     (approximate)   History   Migraine   HPI  Cassandra Beltran is a 22 y.o. female with PMH of migraines who presents for a migraine lasting 5 days.  Patient states that she has tried multiple medications at home and has been unable to get relief.  She has tried several different triptans with her primary care and is just looking to get relief from this current migraine.  She does endorse blurry vision and nausea, she describes the pain as being on one half of her face.     Physical Exam   Triage Vital Signs: ED Triage Vitals  Enc Vitals Group     BP 05/19/23 1630 (!) 144/94     Pulse Rate 05/19/23 1630 71     Resp 05/19/23 1630 16     Temp 05/19/23 1630 98.5 F (36.9 C)     Temp Source 05/19/23 1630 Oral     SpO2 05/19/23 1630 100 %     Weight 05/19/23 1628 113 lb (51.3 kg)     Height 05/19/23 1628 5\' 3"  (1.6 m)     Head Circumference --      Peak Flow --      Pain Score --      Pain Loc --      Pain Edu? --      Excl. in GC? --     Most recent vital signs: Vitals:   05/19/23 1630 05/19/23 1901  BP: (!) 144/94   Pulse: 71 68  Resp: 16 16  Temp: 98.5 F (36.9 C)   SpO2: 100% 100%     General: Awake, no distress.  CV:  Good peripheral perfusion.  Resp:  Normal effort.  Abd:  No distention.  Other:  No focal neurodeficits.   ED Results / Procedures / Treatments   Labs (all labs ordered are listed, but only abnormal results are displayed) Labs Reviewed  POC URINE PREG, ED     PROCEDURES:  Critical Care performed: No  Procedures   MEDICATIONS ORDERED IN ED: Medications  sodium chloride 0.9 % bolus 1,000 mL (0 mLs Intravenous Stopped 05/19/23 1851)  ketorolac (TORADOL) 30 MG/ML injection 30 mg (30 mg Intravenous Given 05/19/23 1717)  diphenhydrAMINE (BENADRYL) injection 12.5 mg (12.5 mg Intravenous Given 05/19/23 1718)   SUMAtriptan (IMITREX) injection 6 mg (6 mg Subcutaneous Given 05/19/23 1733)  metoCLOPramide (REGLAN) injection 10 mg (10 mg Intravenous Given 05/19/23 1717)     IMPRESSION / MDM / ASSESSMENT AND PLAN / ED COURSE  I reviewed the triage vital signs and the nursing notes.                             22 year old female presents with a migraine.  Vital signs stable in triage aside from mildly elevated blood pressure.  No focal neurodeficits on exam.  Differential diagnosis includes, but is not limited to, migraine, cluster headache, tension headache.  Patient's presentation is most consistent with exacerbation of chronic illness.  Patient was given a migraine cocktail including fluid bolus, Toradol, Benadryl, Imitrex and Reglan.  Patient reported an improvement in her symptoms after the medications.  I instructed her to follow-up with her primary care doctor to discuss possible medication changes to address her chronic migraines.  Patient voiced understanding, all questions were answered and she  is stable at discharge.     FINAL CLINICAL IMPRESSION(S) / ED DIAGNOSES   Final diagnoses:  Migraine with aura and with status migrainosus, not intractable     Rx / DC Orders   ED Discharge Orders     None        Note:  This document was prepared using Dragon voice recognition software and may include unintentional dictation errors.   Cameron Ali, PA-C 05/19/23 1904    Jene Every, MD 05/19/23 1919

## 2023-05-28 IMAGING — US US OB < 14 WEEKS - US OB TV
1 series · 15 of 28 positions shown · non-contrast
Comparison: None.

CLINICAL DATA: Vaginal bleeding and cramping.

EXAM:
OBSTETRIC <14 WK US AND TRANSVAGINAL OB US
TECHNIQUE: Both transabdominal and transvaginal ultrasound examinations were
performed for complete evaluation of the gestation as well as the
maternal uterus, adnexal regions, and pelvic cul-de-sac.
Transvaginal technique was performed to assess early pregnancy.

[Series 1: us ob transvaginal mc & wl · 45 acquisitions, 15 frames shown]
[im 1/45]
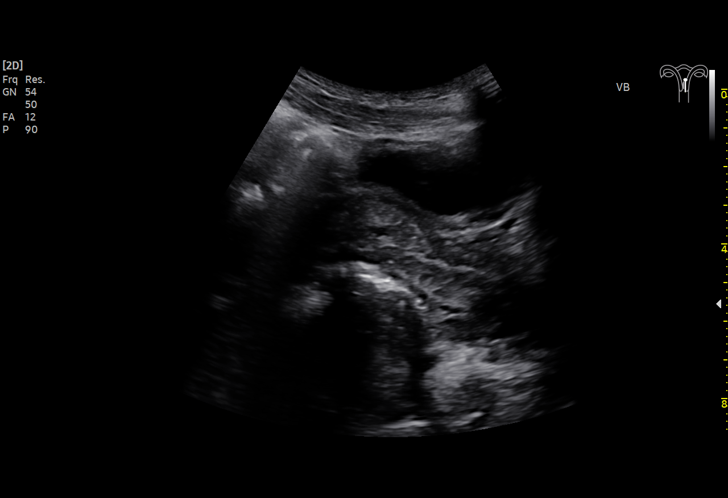
[im 4/45]
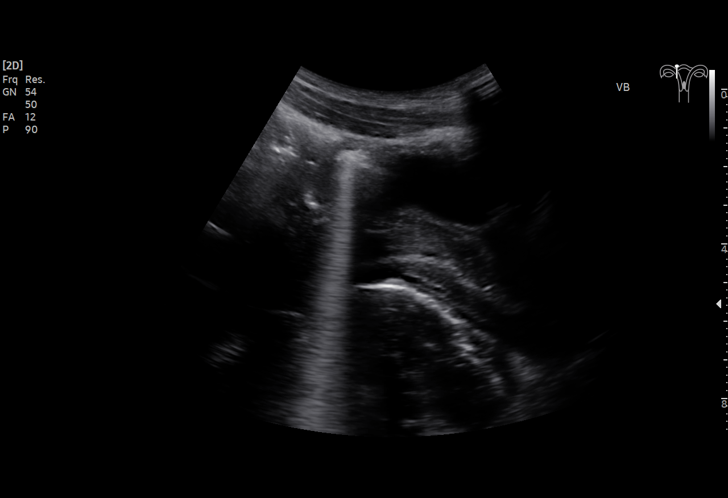
[im 7/45]
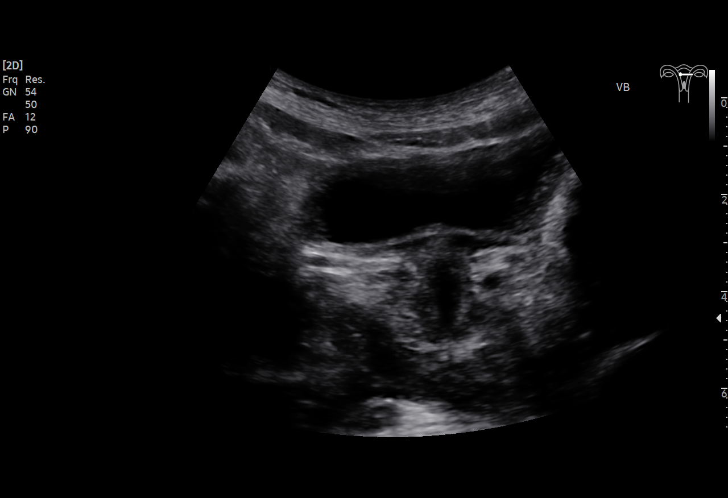
[im 10/45]
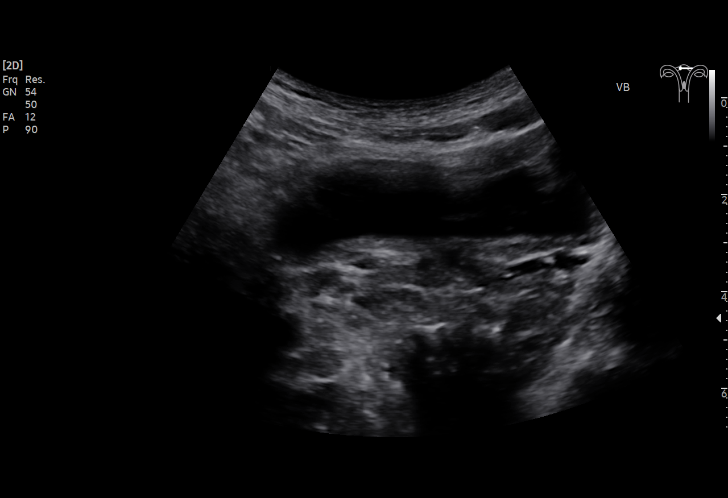
[im 14/45]
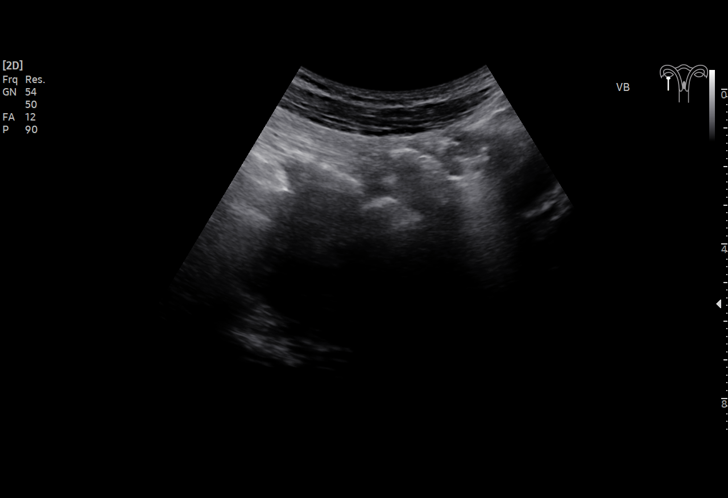
[im 17/45]
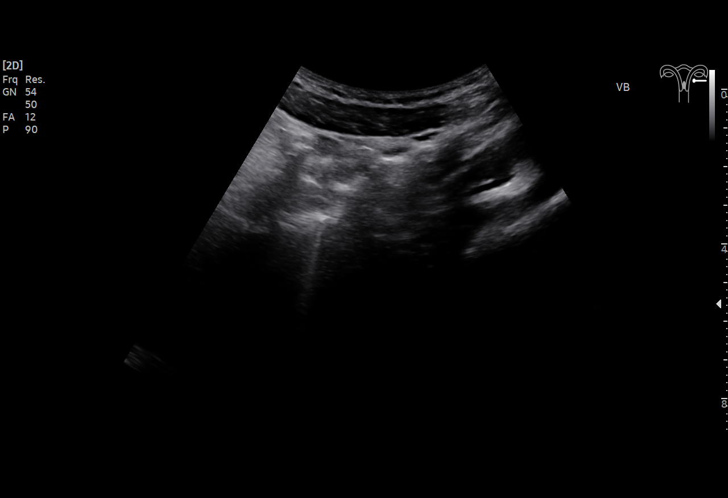
[im 20/45]
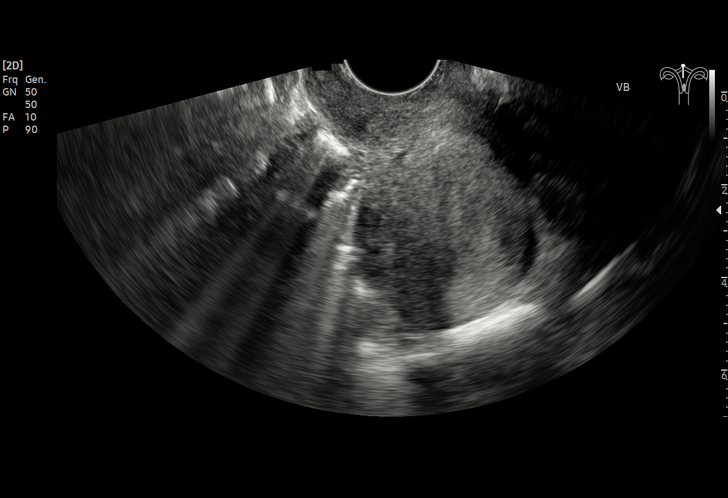
[im 23/45]
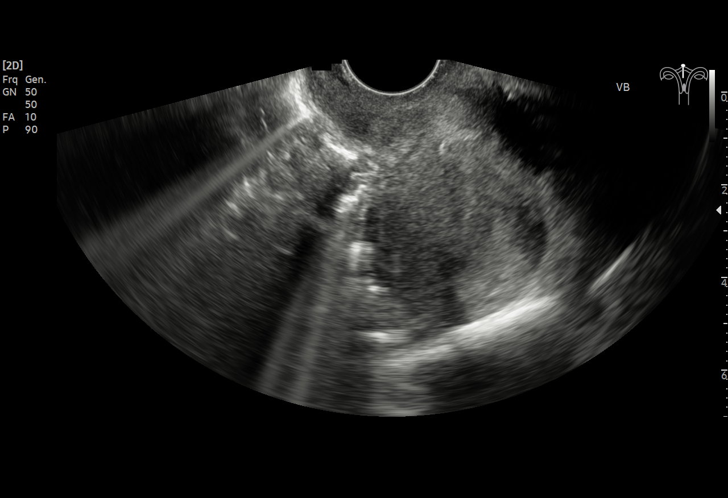
[im 25/45]
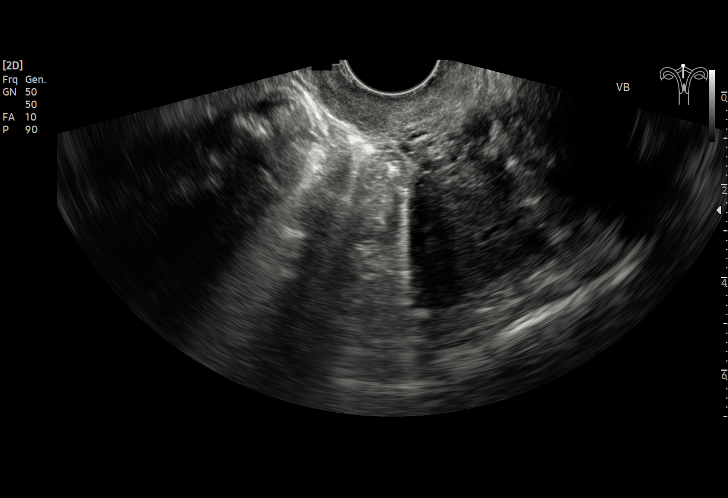
[im 28/45]
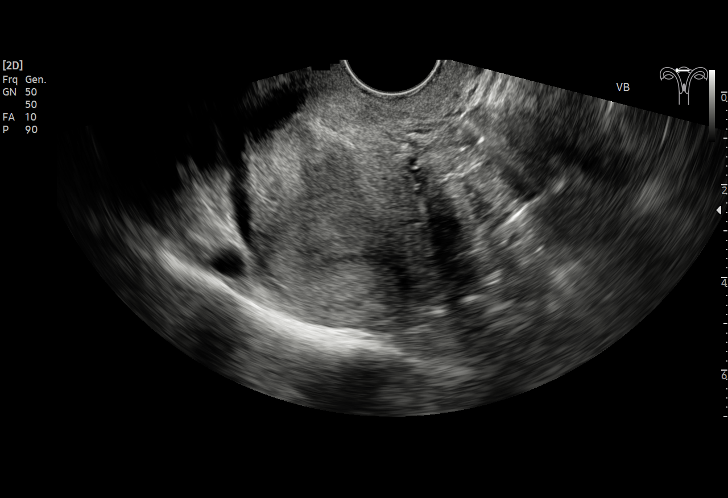
[im 31/45]
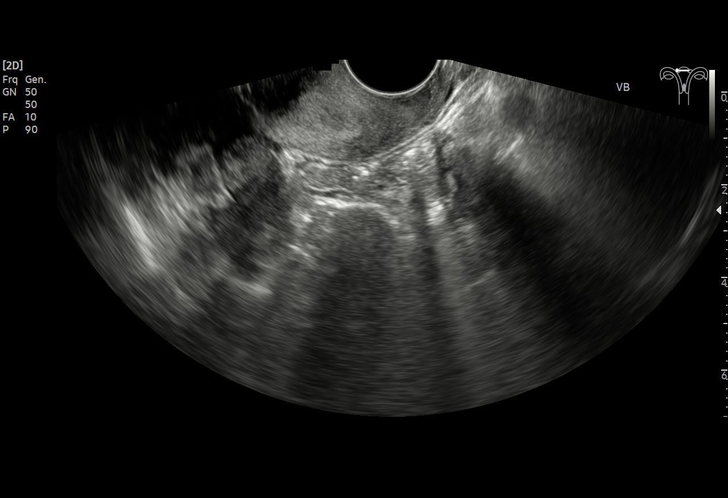
[im 35/45]
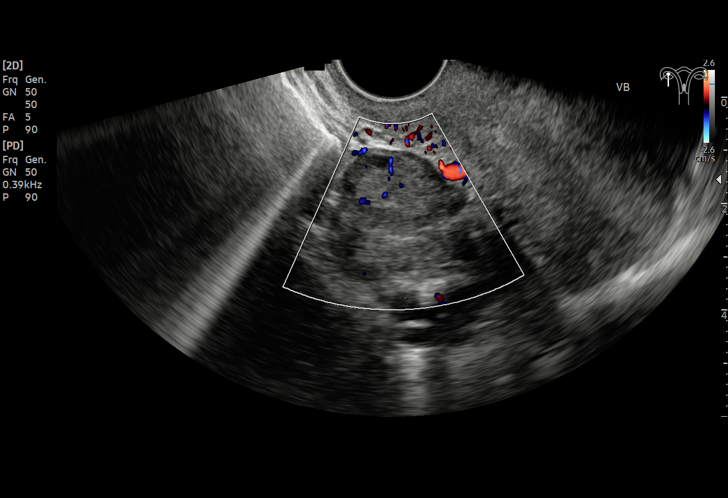
[im 38/45]
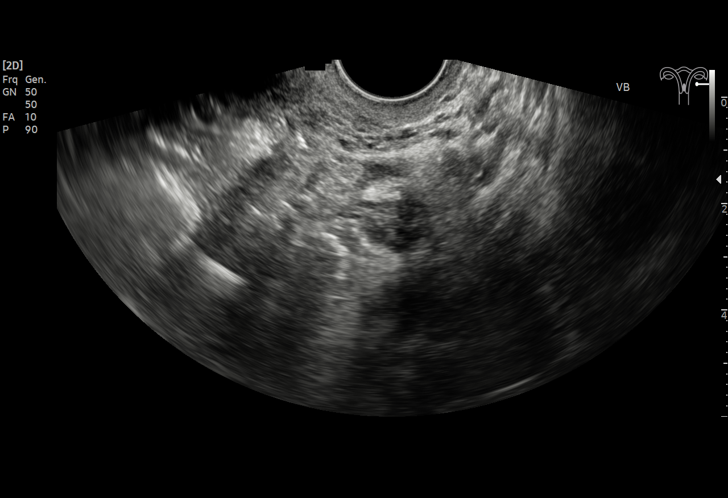
[im 41/45]
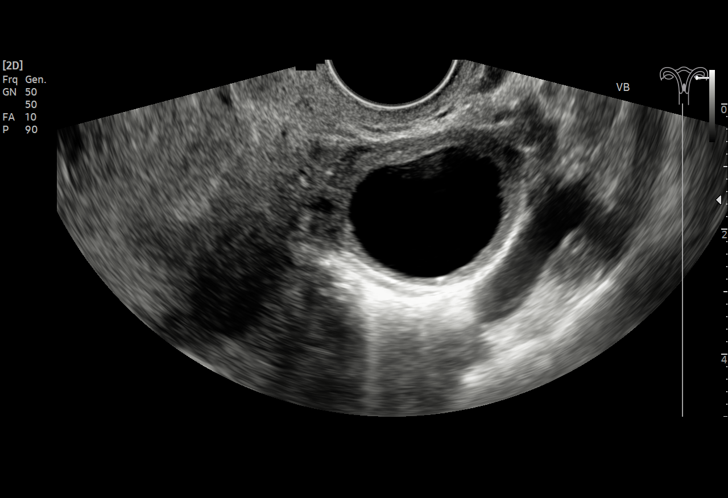
[im 45/45]
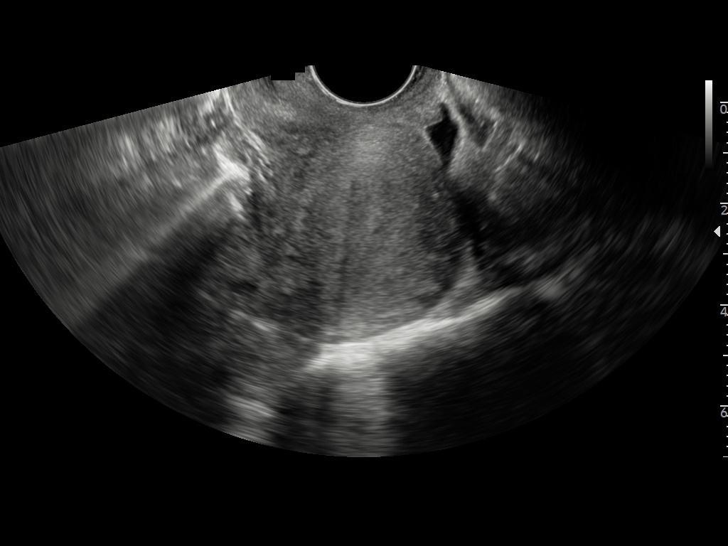

[15 of 28 positions shown; findings below may reference images not displayed]

FINDINGS: Intrauterine gestational sac: None

Yolk sac:  Not Visualized.

Embryo:  Not Visualized.

Maternal uterus/adnexae:

Subchorionic hemorrhage: Not applicable

Right ovary: Appears normal.

Left ovary: Normal containing corpus luteum.

Other :None

Free fluid:  Trace free fluid noted within the pelvis.
IMPRESSION: 1. No intrauterine gestational sac, yolk sac, or fetal pole
identified. Differential considerations include intrauterine
pregnancy too early to be sonographically visualized, missed
abortion, or ectopic pregnancy. Followup ultrasound is recommended
in 10-14 days for further evaluation.
2. Left ovary corpus luteum.
